# Patient Record
Sex: Female | Born: 1950 | Race: Black or African American | Hispanic: No | Smoking: Never smoker
Health system: Southern US, Community
[De-identification: ages and names within clinical notes are randomized; demographics above are authoritative.]

## PROBLEM LIST (undated history)

## (undated) DIAGNOSIS — K219 Gastro-esophageal reflux disease without esophagitis: Secondary | ICD-10-CM

## (undated) DIAGNOSIS — E7800 Pure hypercholesterolemia, unspecified: Secondary | ICD-10-CM

## (undated) DIAGNOSIS — I10 Essential (primary) hypertension: Secondary | ICD-10-CM

---

## 2004-05-10 ENCOUNTER — Ambulatory Visit: Payer: Self-pay | Admitting: Internal Medicine

## 2004-08-29 ENCOUNTER — Other Ambulatory Visit: Payer: Self-pay

## 2004-08-29 ENCOUNTER — Emergency Department: Payer: Self-pay | Admitting: Internal Medicine

## 2005-09-24 ENCOUNTER — Ambulatory Visit: Payer: Self-pay | Admitting: Internal Medicine

## 2006-01-30 ENCOUNTER — Emergency Department: Payer: Self-pay | Admitting: Emergency Medicine

## 2006-04-03 ENCOUNTER — Emergency Department: Payer: Self-pay | Admitting: Emergency Medicine

## 2006-04-04 ENCOUNTER — Ambulatory Visit: Payer: Self-pay | Admitting: Internal Medicine

## 2006-04-04 ENCOUNTER — Other Ambulatory Visit: Payer: Self-pay

## 2008-11-21 DIAGNOSIS — M1990 Unspecified osteoarthritis, unspecified site: Secondary | ICD-10-CM | POA: Insufficient documentation

## 2008-11-21 DIAGNOSIS — I10 Essential (primary) hypertension: Secondary | ICD-10-CM | POA: Insufficient documentation

## 2010-07-02 DIAGNOSIS — E039 Hypothyroidism, unspecified: Secondary | ICD-10-CM | POA: Insufficient documentation

## 2012-01-17 DIAGNOSIS — E785 Hyperlipidemia, unspecified: Secondary | ICD-10-CM | POA: Insufficient documentation

## 2012-08-25 DIAGNOSIS — R928 Other abnormal and inconclusive findings on diagnostic imaging of breast: Secondary | ICD-10-CM | POA: Insufficient documentation

## 2014-10-17 ENCOUNTER — Encounter: Payer: Self-pay | Admitting: Emergency Medicine

## 2014-10-17 ENCOUNTER — Emergency Department
Admission: EM | Admit: 2014-10-17 | Discharge: 2014-10-17 | Disposition: A | Discharge status: Home / Self Care | Payer: 59 | Attending: Emergency Medicine | Admitting: Emergency Medicine

## 2014-10-17 DIAGNOSIS — Y9389 Activity, other specified: Secondary | ICD-10-CM | POA: Diagnosis not present

## 2014-10-17 DIAGNOSIS — Y9289 Other specified places as the place of occurrence of the external cause: Secondary | ICD-10-CM | POA: Diagnosis not present

## 2014-10-17 DIAGNOSIS — I10 Essential (primary) hypertension: Secondary | ICD-10-CM | POA: Insufficient documentation

## 2014-10-17 DIAGNOSIS — T63441A Toxic effect of venom of bees, accidental (unintentional), initial encounter: Secondary | ICD-10-CM | POA: Diagnosis present

## 2014-10-17 DIAGNOSIS — Z79899 Other long term (current) drug therapy: Secondary | ICD-10-CM | POA: Insufficient documentation

## 2014-10-17 DIAGNOSIS — Y998 Other external cause status: Secondary | ICD-10-CM | POA: Diagnosis not present

## 2014-10-17 HISTORY — DX: Pure hypercholesterolemia, unspecified: E78.00

## 2014-10-17 HISTORY — DX: Essential (primary) hypertension: I10

## 2014-10-17 MED ORDER — CIMETIDINE 300 MG PO TABS: 300.0000 mg | ORAL_TABLET | Freq: Two times a day (BID) | ORAL | Status: DC

## 2014-10-17 MED ORDER — TETANUS-DIPHTH-ACELL PERTUSSIS 5-2.5-18.5 LF-MCG/0.5 IM SUSP: 0.5000 mL | Freq: Once | INTRAMUSCULAR | Status: DC

## 2014-10-17 MED ORDER — SULFAMETHOXAZOLE-TRIMETHOPRIM 800-160 MG PO TABS: 1.0000 | ORAL_TABLET | Freq: Two times a day (BID) | ORAL | Status: DC

## 2014-10-17 MED ORDER — DIPHENHYDRAMINE HCL 50 MG/ML IJ SOLN
50.0000 mg | Freq: Once | INTRAMUSCULAR | Status: AC
  Administered 2014-10-17: 50 mg via INTRAMUSCULAR
  Filled 2014-10-17: qty 1

## 2014-10-17 MED ORDER — CEPHALEXIN 500 MG PO CAPS: 500.0000 mg | ORAL_CAPSULE | Freq: Four times a day (QID) | ORAL | Status: DC

## 2014-10-17 MED ORDER — HYDROXYZINE PAMOATE 25 MG PO CAPS: 25.0000 mg | ORAL_CAPSULE | Freq: Three times a day (TID) | ORAL | Status: AC | PRN

## 2014-10-17 MED ORDER — RANITIDINE HCL 150 MG/10ML PO SYRP
300.0000 mg | ORAL_SOLUTION | Freq: Once | ORAL | Status: AC
  Administered 2014-10-17: 300 mg via ORAL
  Filled 2014-10-17: qty 20

## 2014-10-17 NOTE — Discharge Instructions (Signed)

## 2014-10-17 NOTE — ED Provider Notes (Signed)
[facility] Emergency Department Provider Note  ____________________________________________  Time seen: Approximately 11:16 PM  I have reviewed the triage vital signs and the nursing notes.   HISTORY  Chief Complaint Insect Bite    HPI Lynn Paul is a 64 y.o. female presents for evaluation of bee stings to her back and left arm. Patient states that these attacked her 3 days ago and she continues to itch and swell. Denies taking any medication other than the hydrocortisone cream. Any shortness of breath or chest pains.   Past Medical History  Diagnosis Date  . Hypertension   . Hypercholesteremia     There are no active problems to display for this patient.   History reviewed. No pertinent past surgical history.  Current Outpatient Rx  Name  Route  Sig  Dispense  Refill  . cholecalciferol (VITAMIN D) 1000 UNITS tablet   Oral   Take 1,000 Units by mouth daily.         . hydrochlorothiazide (HYDRODIURIL) 25 MG tablet   Oral   Take 25 mg by mouth daily.         Marland Kitchen levothyroxine (SYNTHROID, LEVOTHROID) 75 MCG tablet   Oral   Take 75 mcg by mouth daily before breakfast.         . lovastatin (MEVACOR) 20 MG tablet   Oral   Take 20 mg by mouth daily.         Marland Kitchen omeprazole (PRILOSEC) 20 MG capsule   Oral   Take 20 mg by mouth daily.         . cimetidine (TAGAMET) 300 MG tablet   Oral   Take 1 tablet (300 mg total) by mouth 2 (two) times daily.   14 tablet   0   . hydrOXYzine (VISTARIL) 25 MG capsule   Oral   Take 1 capsule (25 mg total) by mouth 3 (three) times daily as needed.   30 capsule   0     Allergies Review of patient's allergies indicates no known allergies.  History reviewed. No pertinent family history.  Social History History  Substance Use Topics  . Smoking status: Never Smoker   . Smokeless tobacco: Never Used  . Alcohol Use: No    Review of Systems Constitutional: No fever/chills Eyes: No  visual changes. ENT: No sore throat. Cardiovascular: Denies chest pain. Respiratory: Denies shortness of breath. Gastrointestinal: No abdominal pain.  No nausea, no vomiting.  No diarrhea.  No constipation. Genitourinary: Negative for dysuria. Musculoskeletal: Negative for back pain. Skin: Positive for hives and erythema to the back and left forearm. Neurological: Negative for headaches, focal weakness or numbness.  10-point ROS otherwise negative.  ____________________________________________   PHYSICAL EXAM:  VITAL SIGNS: ED Triage Vitals  Enc Vitals Group     BP --      Pulse --      Resp --      Temp --      Temp src --      SpO2 --      Weight --      Height --      Head Cir --      Peak Flow --      Pain Score --      Pain Loc --      Pain Edu? --      Excl. in GC? --     Constitutional: Alert and oriented. Well appearing and in no acute distress. Mouth/Throat: Mucous membranes are moist.  Oropharynx non-erythematous. Neck: No stridor.   Cardiovascular: Normal rate, regular rhythm. Grossly normal heart sounds.  Good peripheral circulation. Respiratory: Normal respiratory effort.  No retractions. Lungs CTAB. Musculoskeletal: No lower extremity tenderness nor edema.  No joint effusions. Neurologic:  Normal speech and language. No gross focal neurologic deficits are appreciated. No gait instability. Skin:  Skin is warm, dry and intact. Multiple areas of erythema with urticaria and hives to the back and left forearm. Psychiatric: Mood and affect are normal. Speech and behavior are normal.  ____________________________________________   LABS (all labs ordered are listed, but only abnormal results are displayed)  Labs Reviewed - No data to display ____________________________________________   PROCEDURES  Procedure(s) performed: None  Critical Care performed: No  ____________________________________________   INITIAL IMPRESSION / ASSESSMENT AND PLAN /  ED COURSE  Pertinent labs & imaging results that were available during my care of the patient were reviewed by me and considered in my medical decision making (see chart for details).  Multiple bee stings. Rx given for Vistaril 25 mg every 6 hours, Tagamet  300 mg twice a day. she is to continue her hydrocortisone cream. Patient voices understanding and denies any other emergency medical complaints at this time. Benadryl 50 mg IM given as well as Zantac 300 mg given by mouth while in the ER. Patient observed and discharged without complaints. ____________________________________________   FINAL CLINICAL IMPRESSION(S) / ED DIAGNOSES  Final diagnoses:  Allergic reaction to bee sting, accidental or unintentional, initial encounter      Evangeline Dakin, PA-C 10/17/14 2346  Maurilio Lovely, MD 10/18/14 1610

## 2014-10-17 NOTE — ED Notes (Signed)
PA at the bedside for pt evaluation 

## 2014-10-17 NOTE — ED Notes (Signed)
Patient to ED with c/o bee stings to back and left arm that happened Saturday, reports areas still bothering her.

## 2015-08-24 DIAGNOSIS — K219 Gastro-esophageal reflux disease without esophagitis: Secondary | ICD-10-CM | POA: Insufficient documentation

## 2015-08-24 DIAGNOSIS — R7303 Prediabetes: Secondary | ICD-10-CM | POA: Insufficient documentation

## 2016-12-06 ENCOUNTER — Emergency Department
Admission: EM | Admit: 2016-12-06 | Discharge: 2016-12-06 | Disposition: A | Discharge status: Home / Self Care | Payer: Medicare Other | Attending: Emergency Medicine | Admitting: Emergency Medicine

## 2016-12-06 ENCOUNTER — Emergency Department: Payer: Medicare Other

## 2016-12-06 DIAGNOSIS — M545 Low back pain: Secondary | ICD-10-CM | POA: Diagnosis not present

## 2016-12-06 DIAGNOSIS — Z79899 Other long term (current) drug therapy: Secondary | ICD-10-CM | POA: Insufficient documentation

## 2016-12-06 DIAGNOSIS — R1032 Left lower quadrant pain: Secondary | ICD-10-CM | POA: Diagnosis not present

## 2016-12-06 DIAGNOSIS — I10 Essential (primary) hypertension: Secondary | ICD-10-CM | POA: Diagnosis not present

## 2016-12-06 HISTORY — DX: Gastro-esophageal reflux disease without esophagitis: K21.9

## 2016-12-06 LAB — URINALYSIS, COMPLETE (UACMP) WITH MICROSCOPIC
Bacteria, UA: NONE SEEN
Bilirubin Urine: NEGATIVE
GLUCOSE, UA: NEGATIVE mg/dL
HGB URINE DIPSTICK: NEGATIVE
KETONES UR: NEGATIVE mg/dL
Nitrite: NEGATIVE
Protein, ur: NEGATIVE mg/dL
Specific Gravity, Urine: 1.023 (ref 1.005–1.030)
pH: 7 (ref 5.0–8.0)

## 2016-12-06 LAB — CBC
HCT: 36.2 % (ref 35.0–47.0)
HEMOGLOBIN: 12.7 g/dL (ref 12.0–16.0)
MCH: 32 pg (ref 26.0–34.0)
MCHC: 34.9 g/dL (ref 32.0–36.0)
MCV: 91.7 fL (ref 80.0–100.0)
Platelets: 202 10*3/uL (ref 150–440)
RBC: 3.95 MIL/uL (ref 3.80–5.20)
RDW: 12.8 % (ref 11.5–14.5)
WBC: 9.1 10*3/uL (ref 3.6–11.0)

## 2016-12-06 LAB — COMPREHENSIVE METABOLIC PANEL
ALK PHOS: 71 U/L (ref 38–126)
ALT: 20 U/L (ref 14–54)
ANION GAP: 11 (ref 5–15)
AST: 23 U/L (ref 15–41)
Albumin: 4.3 g/dL (ref 3.5–5.0)
BILIRUBIN TOTAL: 0.6 mg/dL (ref 0.3–1.2)
BUN: 13 mg/dL (ref 6–20)
CO2: 27 mmol/L (ref 22–32)
Calcium: 9.8 mg/dL (ref 8.9–10.3)
Chloride: 102 mmol/L (ref 101–111)
Creatinine, Ser: 0.72 mg/dL (ref 0.44–1.00)
GFR calc non Af Amer: >60 mL/min (ref >60)
Glucose, Bld: 107 mg/dL — ABNORMAL HIGH (ref 65–99)
Potassium: 3.5 mmol/L (ref 3.5–5.1)
SODIUM: 140 mmol/L (ref 135–145)
Total Protein: 7.7 g/dL (ref 6.5–8.1)

## 2016-12-06 LAB — LIPASE, BLOOD: Lipase: 20 U/L (ref 11–51)

## 2016-12-06 MED ORDER — KETOROLAC TROMETHAMINE 60 MG/2ML IM SOLN
15.0000 mg | Freq: Once | INTRAMUSCULAR | Status: AC
  Administered 2016-12-06: 15 mg via INTRAMUSCULAR
  Filled 2016-12-06: qty 2

## 2016-12-06 MED ORDER — NAPROXEN 500 MG PO TABS
500.0000 mg | ORAL_TABLET | Freq: Two times a day (BID) | ORAL | 0 refills | Status: DC
Start: 2016-12-06 — End: 2020-06-06

## 2016-12-06 NOTE — ED Notes (Signed)
Pt discharged home after verbalizing understanding of discharge instructions; nad noted. 

## 2016-12-06 NOTE — Discharge Instructions (Signed)
Your lab tests and CT scan today are unremarkable.  We are unable to find a cause of your pain, but there does not appear to be a serious medical issue at this time.  Continue monitoring your symptoms and follow up with your doctor this week.

## 2016-12-06 NOTE — ED Notes (Signed)
Pt presents with lower abdominal pain on left side, lower mid back pain. She denies pain or burning upon urination, nausea, vomiting, fever, chills. Pt alert & oriented with NAD noted.

## 2016-12-06 NOTE — ED Provider Notes (Signed)
[facility] Emergency Department Provider Note  ____________________________________________  Time seen: Approximately 4:29 PM  I have reviewed the triage vital signs and the nursing notes.   HISTORY  Chief Complaint Abdominal Pain and Back Pain    HPI Lynn Paul is a 66 y.o. female who complains of constant left lower quadrant pain and left lower back pain for the past 2 days. It is aching, worse with standing motion from a seated position, better with rest. No nausea vomiting diarrhea or constipation. No dysuria frequency urgency. No vaginal bleeding or discharge. No trauma. Moderate intensity.     Past Medical History:  Diagnosis Date  . Acid reflux   . Hypercholesteremia   . Hypertension      There are no active problems to display for this patient.    History reviewed. No pertinent surgical history.   Prior to Admission medications   Medication Sig Start Date End Date Taking? Authorizing Provider  cholecalciferol (VITAMIN D) 1000 UNITS tablet Take 1,000 Units by mouth daily.    [provider]  cimetidine (TAGAMET) 300 MG tablet Take 1 tablet (300 mg total) by mouth 2 (two) times daily. 10/17/14   Beers, Charmayne Sheer, PA-C  hydrochlorothiazide (HYDRODIURIL) 25 MG tablet Take 25 mg by mouth daily.    [provider]  hydrOXYzine (VISTARIL) 25 MG capsule Take 1 capsule (25 mg total) by mouth 3 (three) times daily as needed. 10/17/14   Beers, Charmayne Sheer, PA-C  levothyroxine (SYNTHROID, LEVOTHROID) 75 MCG tablet Take 75 mcg by mouth daily before breakfast.    [provider]  lovastatin (MEVACOR) 20 MG tablet Take 20 mg by mouth daily.    [provider]  naproxen (NAPROSYN) 500 MG tablet Take 1 tablet (500 mg total) by mouth 2 (two) times daily with a meal. 12/06/16   Sharman Cheek, MD  omeprazole (PRILOSEC) 20 MG capsule Take 20 mg by mouth daily.    [provider]     Allergies Patient has no  known allergies.   No family history on file.  Social History Social History  Substance Use Topics  . Smoking status: Never Smoker  . Smokeless tobacco: Never Used  . Alcohol use No    Review of Systems  Constitutional:   No fever or chills.  ENT:   No sore throat. No rhinorrhea. Cardiovascular:   No chest pain or syncope. Respiratory:   No dyspnea or cough. Gastrointestinal:   positive as above for abdominal pain without vomiting and diarrhea.  Musculoskeletal:   Negative for focal pain or swelling All other systems reviewed and are negative except as documented above in ROS and HPI.  ____________________________________________   PHYSICAL EXAM:  VITAL SIGNS: ED Triage Vitals [12/06/16 1112]  Enc Vitals Group     BP 139/62     Pulse Rate 63     Resp 18     Temp 97.7 F (36.5 C)     Temp Source Oral     SpO2 100 %     Weight 168 lb (76.2 kg)     Height  (1.651 m)     Head Circumference      Peak Flow      Pain Score 10     Pain Loc      Pain Edu?      Excl. in GC?     Vital signs reviewed, nursing assessments reviewed.   Constitutional:   Alert and oriented. Well appearing and in no  distress. Eyes:   No scleral icterus.  EOMI. No nystagmus. No conjunctival pallor. PERRL. ENT   Head:   Normocephalic and atraumatic.   Nose:   No congestion/rhinnorhea.    Mouth/Throat:   MMM, no pharyngeal erythema. No peritonsillar mass.    Neck:   No meningismus. Full ROM Hematological/Lymphatic/Immunilogical:   No cervical lymphadenopathy. Cardiovascular:   RRR. Symmetric bilateral radial and DP pulses.  No murmurs.  Respiratory:   Normal respiratory effort without tachypnea/retractions. Breath sounds are clear and equal bilaterally. No wheezes/rales/rhonchi. Gastrointestinal:   Soft and nontender. Non distended. There is no CVA tenderness.  No rebound, rigidity, or guarding. Genitourinary:   deferred Musculoskeletal:   Normal range of motion in all  extremities. No joint effusions.  No lower extremity tenderness.  No edema.no pain with extension of the left hip. Neurologic:   Normal speech and language.  Motor grossly intact. No gross focal neurologic deficits are appreciated.  Skin:    Skin is warm, dry and intact. No rash noted.  No petechiae, purpura, or bullae.  ____________________________________________    LABS (pertinent positives/negatives) (all labs ordered are listed, but only abnormal results are displayed) Labs Reviewed  COMPREHENSIVE METABOLIC PANEL - Abnormal; Notable for the following:       Result Value   Glucose, Bld 107 (*)    All other components within normal limits  URINALYSIS, COMPLETE (UACMP) WITH MICROSCOPIC - Abnormal; Notable for the following:    Color, Urine YELLOW (*)    APPearance CLEAR (*)    Leukocytes, UA SMALL (*)    Squamous Epithelial / LPF 0-5 (*)    All other components within normal limits  LIPASE, BLOOD  CBC   ____________________________________________   EKG    ____________________________________________    RADIOLOGY  Ct Renal Stone Study  Result Date: 12/06/2016 CLINICAL DATA:  66 year old female with history of left lower quadrant abdominal pain for the past 2 days and lower back pain for the past 2 days. EXAM: CT ABDOMEN AND PELVIS WITHOUT CONTRAST TECHNIQUE: Multidetector CT imaging of the abdomen and pelvis was performed following the standard protocol without IV contrast. COMPARISON:  No priors. FINDINGS: Lower chest: Aortic atherosclerosis. Hepatobiliary: No definite cystic or solid hepatic lesions are identified on today's noncontrast CT examination. Calcified granuloma in the periphery of segment 5 of the liver incidentally noted. Gallbladder is normal in appearance. Pancreas: No definite pancreatic mass or peripancreatic inflammatory changes are noted on today's noncontrast CT examination. Spleen: Unremarkable. Adrenals/Urinary Tract: There are no abnormal  calcifications within the collecting system of either kidney, along the course of either ureter, or within the lumen of the urinary bladder. No hydroureteronephrosis or perinephric stranding to suggest urinary tract obstruction at this time. The unenhanced appearance of the kidneys is unremarkable bilaterally. Unenhanced appearance of the urinary bladder is normal. Bilateral adrenal glands are normal in appearance. Stomach/Bowel: Unenhanced appearance of the stomach is normal. No pathologic dilatation of small bowel or colon. A few scattered colonic diverticulae are noted, without surrounding inflammatory changes to suggest an acute diverticulitis at this time. Normal appendix. Vascular/Lymphatic: Aortic atherosclerosis, without evidence of aneurysm in the abdominal or pelvic vasculature. No lymphadenopathy noted in the abdomen or pelvis. Reproductive: Small coarse calcifications in the fundus of the uterus, presumably related to an underlying fibroid. Ovaries are unremarkable in appearance. Other: No significant volume of ascites.  No pneumoperitoneum. Musculoskeletal: There are no aggressive appearing lytic or blastic lesions noted in the visualized portions of the skeleton. IMPRESSION: 1. No  acute findings are noted in the abdomen or pelvis to account for the patient's symptoms. 2. Colonic diverticulosis without evidence of acute diverticulitis at this time. 3. Aortic atherosclerosis. 4. Normal appendix. Aortic Atherosclerosis (ICD10-I70.0). Electronically Signed   By: Trudie Reed M.D.   On: 12/06/2016 15:25    ____________________________________________   PROCEDURES Procedures  ____________________________________________   INITIAL IMPRESSION / ASSESSMENT AND PLAN / ED COURSE  Pertinent labs & imaging results that were available during my care of the patient were reviewed by me and considered in my medical decision making (see chart for details).  patient's well-appearing no acute  distress, presents with left lower quadrant pain. Vitals are normal, labs are unremarkable, and exam is benign. Due to age and lack of a specific etiology that I can pinpoint, CT scan was obtained which is also unremarkable. No evidence of stone or obstruction or diverticulitis. Plan to discharge the patient home, continue NSAIDs, follow-up with primary care.Considering the patient's symptoms, medical history, and physical examination today, I have low suspicion for cholecystitis or biliary pathology, pancreatitis, perforation or bowel obstruction, hernia, intra-abdominal abscess, AAA or dissection, volvulus or intussusception, mesenteric ischemia, or appendicitis.        ____________________________________________   FINAL CLINICAL IMPRESSION(S) / ED DIAGNOSES  Final diagnoses:  LLQ pain      New Prescriptions   NAPROXEN (NAPROSYN) 500 MG TABLET    Take 1 tablet (500 mg total) by mouth 2 (two) times daily with a meal.     Portions of this note were generated with dragon dictation software. Dictation errors may occur despite best attempts at proofreading.    Sharman Cheek, MD 12/06/16 [phone]

## 2016-12-06 NOTE — ED Triage Notes (Signed)
Pt c/o LLQ pain X 2 days, lower back pain X 2 days. Pt alert and oriented X4, active, cooperative, pt in NAD. RR even and unlabored, color WNL.

## 2017-03-18 DIAGNOSIS — R198 Other specified symptoms and signs involving the digestive system and abdomen: Secondary | ICD-10-CM | POA: Insufficient documentation

## 2019-06-09 DIAGNOSIS — Z720 Tobacco use: Secondary | ICD-10-CM | POA: Insufficient documentation

## 2019-10-12 ENCOUNTER — Other Ambulatory Visit: Payer: Self-pay

## 2019-10-12 ENCOUNTER — Ambulatory Visit: Payer: Medicare Other | Admitting: Podiatry

## 2019-10-12 DIAGNOSIS — M79674 Pain in right toe(s): Secondary | ICD-10-CM | POA: Diagnosis not present

## 2019-10-12 DIAGNOSIS — Q828 Other specified congenital malformations of skin: Secondary | ICD-10-CM

## 2019-10-12 DIAGNOSIS — M7752 Other enthesopathy of left foot: Secondary | ICD-10-CM

## 2019-10-12 DIAGNOSIS — B351 Tinea unguium: Secondary | ICD-10-CM | POA: Diagnosis not present

## 2019-10-12 DIAGNOSIS — M79675 Pain in left toe(s): Secondary | ICD-10-CM | POA: Diagnosis not present

## 2019-10-13 ENCOUNTER — Encounter: Payer: Self-pay | Admitting: Podiatry

## 2019-10-13 NOTE — Progress Notes (Signed)
  Subjective:  Patient ID: Lynn Paul, female    DOB: Apr 03, 1950,  MRN: [id number]  Chief Complaint  Patient presents with  . Callouses    pt is here for a corn/ callus trim   69 y.o. female returns for the above complaint.  Patient presents with thickened elongated dystrophic toenails x10.  Patient states is mildly painful to touch.  Patient would like to have them debrided down as she is not able to do it herself.  She has secondary complaint of left fifth metatarsophalangeal joint capsulitis with underlying benign skin lesion/porokeratosis.  Patient states is painful to walk on.  She has tried offloading it has had tried over-the-counter treatment options but none of that has helped.  She would like for me to discuss treatment options with her.  She denies any other acute complaints.  Objective:  There were no vitals filed for this visit. Podiatric Exam: Vascular: dorsalis pedis and posterior tibial pulses are palpable bilateral. Capillary return is immediate. Temperature gradient is WNL. Skin turgor WNL  Sensorium: Normal Semmes Weinstein monofilament test. Normal tactile sensation bilaterally. Nail Exam: Pt has thick disfigured discolored nails with subungual debris noted bilateral entire nail hallux through fifth toenails.  Pain on palpation to the nails. Ulcer Exam: There is no evidence of ulcer or pre-ulcerative changes or infection. Orthopedic Exam: Muscle tone and strength are WNL. No limitations in general ROM. No crepitus or effusions noted. HAV  B/L.  Hammer toes 2-5  B/L. Skin: Porokeratosis noted to the left submetatarsal 5 with central nucleated core.  No pinpoint bleeding noted upon debridement no infection or ulcers    Assessment & Plan:  No diagnosis found.  Patient was evaluated and treated and all questions answered.  Left fifth MPJ porokeratosis/benign skin lesion with underlying capsulitis -I explained to the patient the etiology of porokeratosis and various  treatment options were asked discussed.  I explained to her that given the pain that she is having with range of motion of the fifth metatarsophalangeal joint.  There is probably inflammation component associated with pain.  I believe she will benefit from a steroid injection.  Patient agrees with the plan would like to proceed with steroid injection.  I will also plan on doing aggressive debridement with excision of the central nucleated core. -Using chisel blade and a handle the nucleated core was excised out with relief of pain. -A steroid injection was performed at left fifth MPJ using 1% plain Lidocaine and 10 mg of Kenalog. This was well tolerated.   Onychomycosis with pain  -Nails palliatively debrided as below. -Educated on self-care  Procedure: Nail Debridement Rationale: pain  Type of Debridement: manual, sharp debridement. Instrumentation: Nail nipper, rotary burr. Number of Nails: 10  Procedures and Treatment: Consent by patient was obtained for treatment procedures. The patient understood the discussion of treatment and procedures well. All questions were answered thoroughly reviewed. Debridement of mycotic and hypertrophic toenails, 1 through 5 bilateral and clearing of subungual debris. No ulceration, no infection noted.  Return Visit-Office Procedure: Patient instructed to return to the office for a follow up visit 3 months for continued evaluation and treatment.  Nicholes Rough, DPM    No follow-ups on file.

## 2019-12-08 ENCOUNTER — Encounter: Payer: Medicare Other | Admitting: Obstetrics and Gynecology

## 2019-12-08 NOTE — Progress Notes (Deleted)
Paul, Lynn Hibbs, MD   No chief complaint on file.   HPI:      [name] is a 69 y.o. No obstetric history on file. whose LMP was No LMP recorded. Patient is postmenopausal., presents today for ***    Past Medical History:  Diagnosis Date   Acid reflux    Hypercholesteremia    Hypertension     No past surgical history on file.  No family history on file.  Social History   Socioeconomic History   Marital status: Widowed    Spouse name: Not on file   Number of children: Not on file   Years of education: Not on file   Highest education level: Not on file  Occupational History   Not on file  Tobacco Use   Smoking status: Never Smoker   Smokeless tobacco: Never Used  Substance and Sexual Activity   Alcohol use: No   Drug use: No   Sexual activity: Not on file  Other Topics Concern   Not on file  Social History Narrative   Not on file   Social Determinants of Health   Financial Resource Strain:    Difficulty of Paying Living Expenses: Not on file  Food Insecurity:    Worried About Running Out of Food in the Last Year: Not on file   Ran Out of Food in the Last Year: Not on file  Transportation Needs:    Lack of Transportation (Medical): Not on file   Lack of Transportation (Non-Medical): Not on file  Physical Activity:    Days of Exercise per Week: Not on file   Minutes of Exercise per Session: Not on file  Stress:    Feeling of Stress : Not on file  Social Connections:    Frequency of Communication with Friends and Family: Not on file   Frequency of Social Gatherings with Friends and Family: Not on file   Attends Religious Services: Not on file   Active Member of Clubs or Organizations: Not on file   Attends Banker Meetings: Not on file   Marital Status: Not on file  Intimate Partner Violence:    Fear of Current or Ex-Partner: Not on file   Emotionally Abused: Not on file   Physically  Abused: Not on file   Sexually Abused: Not on file    Outpatient Medications Prior to Visit  Medication Sig Dispense Refill   amLODipine (NORVASC) 5 MG tablet Take 5 mg by mouth 2 (two) times daily.     cholecalciferol (VITAMIN D) 1000 UNITS tablet Take 1,000 Units by mouth daily.     cimetidine (TAGAMET) 300 MG tablet Take 1 tablet (300 mg total) by mouth 2 (two) times daily. (Patient not taking: Reported on 10/12/2019) 14 tablet 0   hydrochlorothiazide (HYDRODIURIL) 25 MG tablet Take 25 mg by mouth daily.     hydrOXYzine (VISTARIL) 25 MG capsule Take 1 capsule (25 mg total) by mouth 3 (three) times daily as needed. 30 capsule 0   levothyroxine (SYNTHROID, LEVOTHROID) 75 MCG tablet Take 75 mcg by mouth daily before breakfast.     lovastatin (MEVACOR) 40 MG tablet Take 40 mg by mouth daily.     naproxen (NAPROSYN) 500 MG tablet Take 1 tablet (500 mg total) by mouth 2 (two) times daily with a meal. 20 tablet 0   nitrofurantoin, macrocrystal-monohydrate, (MACROBID) 100 MG capsule Take 100 mg by mouth every 12 (twelve) hours.     omeprazole (PRILOSEC)  20 MG capsule Take 20 mg by mouth daily.     No facility-administered medications prior to visit.      ROS:  Review of Systems BREAST: No symptoms   OBJECTIVE:   Vitals:  There were no vitals taken for this visit.  Physical Exam  Results: No results found for this or any previous visit (from the past 24 hour(s)).   Assessment/Plan: No diagnosis found.    No orders of the defined types were placed in this encounter.     No follow-ups on file.  Ulla Mckiernan B. Willeen Novak, PA-C 12/08/2019 10:33 AM

## 2019-12-20 ENCOUNTER — Encounter: Payer: Self-pay | Admitting: Obstetrics & Gynecology

## 2019-12-20 ENCOUNTER — Other Ambulatory Visit (HOSPITAL_COMMUNITY)
Admission: RE | Admit: 2019-12-20 | Discharge: 2019-12-20 | Disposition: A | Discharge status: Home / Self Care | Payer: Medicare Other | Source: Ambulatory Visit | Attending: Obstetrics & Gynecology | Admitting: Obstetrics & Gynecology

## 2019-12-20 ENCOUNTER — Other Ambulatory Visit: Payer: Self-pay

## 2019-12-20 ENCOUNTER — Ambulatory Visit (INDEPENDENT_AMBULATORY_CARE_PROVIDER_SITE_OTHER): Payer: Medicare Other | Admitting: Obstetrics & Gynecology

## 2019-12-20 VITALS — BP 128/80 | Ht 65.0 in | Wt 170.0 lb

## 2019-12-20 DIAGNOSIS — Z124 Encounter for screening for malignant neoplasm of cervix: Secondary | ICD-10-CM

## 2019-12-20 DIAGNOSIS — N950 Postmenopausal bleeding: Secondary | ICD-10-CM

## 2019-12-20 DIAGNOSIS — R3989 Other symptoms and signs involving the genitourinary system: Secondary | ICD-10-CM

## 2019-12-20 LAB — POCT URINALYSIS DIPSTICK
Bilirubin, UA: NEGATIVE
Blood, UA: NEGATIVE
Glucose, UA: NEGATIVE
Ketones, UA: NEGATIVE
Nitrite, UA: NEGATIVE
Protein, UA: NEGATIVE
Spec Grav, UA: 1.01 (ref 1.010–1.025)
Urobilinogen, UA: 0.2 E.U./dL
pH, UA: 5 (ref 5.0–8.0)

## 2019-12-20 NOTE — Addendum Note (Signed)
Addended by: Cornelius Moras D on: 12/20/2019 09:34 AM   Modules accepted: Orders

## 2019-12-20 NOTE — Patient Instructions (Signed)

## 2019-12-20 NOTE — Progress Notes (Signed)
Lynn Paul Patient is a 69 yo G2P2 AA F who complains of 2 month h/o intermittant vaginal Paul. She has been menopausal for several years. Currently on no HRT and no blood thinner medicine.  She did start a new NSAID 2 mos ago for knee pain/ OA.   Paul is described as scant staining and has occurred a few times. Other menopausal symptoms include: none. Workup to date: she has been seen w hematuria noted and has been treated once for UTI (over the last 2 mos).  Menstrual History: OB History   NSVD x2 Has 4 grandchildren and 1 great     PMHx: She  has a past medical history of Acid reflux, Hypercholesteremia, and Hypertension. Also,  has no past surgical history on file., family history includes Breast cancer (age of onset: 22) in her daughter; Colon cancer (age of onset: 76) in her brother.,  reports that she has never smoked. She has never used smokeless tobacco. She reports that she does not drink alcohol and does not use drugs.  She has a current medication list which includes the following prescription(s): amlodipine, cholecalciferol, hydrochlorothiazide, levothyroxine, lovastatin, meloxicam, omeprazole, cimetidine, hydroxyzine, naproxen, and nitrofurantoin (macrocrystal-monohydrate). Also, has No Known Allergies.  Review of Systems  Constitutional: Negative for chills, fever and malaise/fatigue.  HENT: Negative for congestion, sinus pain and sore throat.   Eyes: Negative for blurred vision and pain.  Respiratory: Negative for cough and wheezing.   Cardiovascular: Negative for chest pain and leg swelling.  Gastrointestinal: Negative for abdominal pain, constipation, diarrhea, heartburn, nausea and vomiting.  Genitourinary: Negative for dysuria, frequency, hematuria and urgency.  Musculoskeletal: Negative for back pain, joint pain, myalgias and neck pain.  Skin: Negative for itching and rash.  Neurological: Negative for dizziness, tremors and weakness.    Endo/Heme/Allergies: Does not bruise/bleed easily.  Psychiatric/Behavioral: Negative for depression. The patient is not nervous/anxious and does not have insomnia.     Objective: BP 128/80    Ht 5\' 5"  (1.651 m)    Wt 170 lb (77.1 kg)    BMI 28.29 kg/m  Physical Exam Constitutional:      General: She is not in acute distress.    Appearance: She is well-developed.  Genitourinary:     Pelvic exam was performed with patient supine.     Vagina and uterus normal.     No vaginal erythema or Paul.     No cervical motion tenderness, discharge, polyp or nabothian cyst.     Uterus is mobile.     Uterus is not enlarged.     No uterine mass detected.    Uterus is midaxial.     No right or left adnexal mass present.     Right adnexa not tender.     Left adnexa not tender.     Genitourinary Comments: Small uterus Small cervix, narrow canal  HENT:     Head: Normocephalic and atraumatic.     Nose: Nose normal.  Abdominal:     General: There is no distension.     Palpations: Abdomen is soft.     Tenderness: There is no abdominal tenderness.  Musculoskeletal:        General: Normal range of motion.  Neurological:     Mental Status: She is alert and oriented to person, place, and time.     Cranial Nerves: No cranial nerve deficit.  Skin:    General: Skin is warm and dry.  Psychiatric:        Attention  and Perception: Attention normal.        Mood and Affect: Mood and affect normal.        Speech: Speech normal.        Behavior: Behavior normal.        Thought Content: Thought content normal.        Judgment: Judgment normal.     ASSESSMENT/PLAN:     No problem-specific Assessment & Plan notes found for this encounter.  Problem List Items Addressed This Visit      Other   Post-menopausal Paul - Primary   Relevant Orders   Surgical pathology    Other Visit Diagnoses    Screening for cervical cancer       Relevant Orders   Cytology - PAP   Sensation of pressure in  bladder area       Relevant Orders   Urine Culture    Options for EMB, [place], exp mgt discussed.    EMB today.  Consider [place] if sx's persist, for polyps or other etiology.  Plan visit to discuss results one week.    May consider scheduling and [place], discuss more at that time  Endometrial Biopsy After discussion with the patient regarding her abnormal uterine Paul I recommended that she proceed with an endometrial biopsy for further diagnosis. The risks, benefits, alternatives, and indications for an endometrial biopsy were discussed with the patient in detail. She understood the risks including infection, Paul, cervical laceration and uterine perforation.  Verbal consent was obtained.   PROCEDURE NOTE:  Pipelle endometrial biopsy was performed using aseptic technique with iodine preparation.  The uterus was sounded to a length of 6 cm.  Adequate sampling was obtained with minimal blood loss.  The patient tolerated the procedure well.  Disposition will be pending pathology.  Annamarie Major, MD, [facility], [facility] 12/20/2019  9:21 AM

## 2019-12-21 LAB — CYTOLOGY - PAP: Diagnosis: NEGATIVE

## 2019-12-21 LAB — SURGICAL PATHOLOGY

## 2019-12-22 LAB — URINE CULTURE

## 2019-12-27 ENCOUNTER — Ambulatory Visit (INDEPENDENT_AMBULATORY_CARE_PROVIDER_SITE_OTHER): Payer: Medicare Other | Admitting: Obstetrics & Gynecology

## 2019-12-27 ENCOUNTER — Other Ambulatory Visit: Payer: Self-pay

## 2019-12-27 ENCOUNTER — Encounter: Payer: Self-pay | Admitting: Obstetrics & Gynecology

## 2019-12-27 VITALS — BP 120/80 | Ht 65.0 in | Wt 167.0 lb

## 2019-12-27 DIAGNOSIS — N950 Postmenopausal bleeding: Secondary | ICD-10-CM

## 2019-12-27 NOTE — Progress Notes (Signed)
  History of Present Illness:  Lynn Paul is a 69 y.o. who recently underwent labwork for PMB.  She presents for discussion of results and plan of management. Results revealed normal EMB, also normal PAP.  She continues w almost daily blood when wiping, pink.  No BRB, no pad needed.  No pain.  PMHx: She  has a past medical history of Acid reflux, Hypercholesteremia, and Hypertension. Also,  has no past surgical history on file., family history includes Breast cancer (age of onset: 62) in her daughter; Colon cancer (age of onset: 84) in her brother.,  reports that she has never smoked. She has never used smokeless tobacco. She reports that she does not drink alcohol and does not use drugs. Current Meds  Medication Sig  . amLODipine (NORVASC) 5 MG tablet Take 5 mg by mouth 2 (two) times daily.  . cholecalciferol (VITAMIN D) 1000 UNITS tablet Take 1,000 Units by mouth daily.  . cimetidine (TAGAMET) 300 MG tablet Take 1 tablet (300 mg total) by mouth 2 (two) times daily.  . hydrochlorothiazide (HYDRODIURIL) 25 MG tablet Take 25 mg by mouth daily.  . hydrOXYzine (VISTARIL) 25 MG capsule Take 1 capsule (25 mg total) by mouth 3 (three) times daily as needed.  Marland Kitchen levothyroxine (SYNTHROID, LEVOTHROID) 75 MCG tablet Take 75 mcg by mouth daily before breakfast.  . lovastatin (MEVACOR) 40 MG tablet Take 40 mg by mouth daily.  . meloxicam (MOBIC) 15 MG tablet Take 15 mg by mouth daily.  . naproxen (NAPROSYN) 500 MG tablet Take 1 tablet (500 mg total) by mouth 2 (two) times daily with a meal.  . omeprazole (PRILOSEC) 20 MG capsule Take 20 mg by mouth daily.  . Also, has No Known Allergies..  Review of Systems  All other systems reviewed and are negative.   Physical Exam:  BP 120/80   Ht 5\' 5"  (1.651 m)   Wt 167 lb (75.8 kg)   BMI 27.79 kg/m  Body mass index is 27.79 kg/m. Constitutional: Well nourished, well developed female in no acute distress.  Abdomen: diffusely non tender to palpation,  non distended, and no masses, hernias Neuro: Grossly intact Psych:  Normal mood and affect.    Assessment:  Problem List Items Addressed This Visit      Other   Post-menopausal bleeding - Primary      Plan: Detailed discussion of results today. Options for management discussed. Info provided. At this time, we will plan for to see if she has thickened lining or sogn of polyp or fibroid. She was amenable to this plan.  A total of 20 minutes were spent face-to-face with the patient as well as preparation, review, communication, and documentation during this encounter.    [place], MD, An[facility] Major [facility]/G[facility], [facility] 12/27/2019  9:51 AM

## 2020-01-10 ENCOUNTER — Other Ambulatory Visit: Payer: Self-pay

## 2020-01-10 ENCOUNTER — Ambulatory Visit: Payer: Medicare Other | Admitting: Obstetrics & Gynecology

## 2020-01-10 ENCOUNTER — Ambulatory Visit (INDEPENDENT_AMBULATORY_CARE_PROVIDER_SITE_OTHER): Payer: Medicare Other

## 2020-01-10 DIAGNOSIS — N950 Postmenopausal bleeding: Secondary | ICD-10-CM

## 2020-01-11 ENCOUNTER — Ambulatory Visit (INDEPENDENT_AMBULATORY_CARE_PROVIDER_SITE_OTHER): Payer: Medicare Other | Admitting: Obstetrics & Gynecology

## 2020-01-11 ENCOUNTER — Encounter: Payer: Self-pay | Admitting: Obstetrics & Gynecology

## 2020-01-11 VITALS — BP 120/80 | Ht 65.0 in | Wt 171.0 lb

## 2020-01-11 DIAGNOSIS — N950 Postmenopausal bleeding: Secondary | ICD-10-CM | POA: Diagnosis not present

## 2020-01-11 NOTE — Progress Notes (Signed)
  HPI: Pt has intermittent light bleeding, none ober the past week.  EMB and PAP normal recently.  [place] today to assess for alternatrive etiology.  Ultrasound demonstrates a thin ES (1.35mm) and two small fibroids, and potential fluid in the endocervical canal, see below  PMHx: She  has a past medical history of Acid reflux, Hypercholesteremia, and Hypertension. Also,  has no past surgical history on file., family history includes Breast cancer (age of onset: 48) in her daughter; Colon cancer (age of onset: 17) in her brother.,  reports that she has never smoked. She has never used smokeless tobacco. She reports that she does not drink alcohol and does not use drugs.  She has a current medication list which includes the following prescription(s): amlodipine, cholecalciferol, cimetidine, hydrochlorothiazide, hydroxyzine, levothyroxine, lovastatin, meloxicam, naproxen, nitrofurantoin (macrocrystal-monohydrate), and omeprazole. Also, has No Known Allergies.  Review of Systems  All other systems reviewed and are negative.   Objective: BP 120/80   Ht 5\' 5"  (1.651 m)   Wt 171 lb (77.6 kg)   BMI 28.46 kg/m   Physical examination Constitutional NAD, Conversant  Skin No rashes, lesions or ulceration.   Extremities: Moves all appropriately.  Normal ROM for age. No lymphadenopathy.  Neuro: Grossly intact  Psych: Oriented to PPT.  Normal mood. Normal affect.   PELVIC COMPLETE WITH TRANSVAGINAL  Result Date: 01/10/2020 Patient Name: Lynn Paul DOB: 02/07/1951 MRN: [id number] ULTRASOUND REPORT Location: [facility] Date of Service: 01/10/2020 Indications:Abnormal Uterine Bleeding Findings: The uterus is anteverted and measures 5.9 x 4.3 x 2.8 cm. Echo texture is heterogenous with evidence of focal masses. Within the uterus are multiple suspected fibroids measuring: Fibroid 1:13.1 x 5.7 x 9.7 mm intramural right Fibroid 2:9.7 x 7.7 x 7.0 mm intramural right, partially calcified The  Endometrium measures 1.6 mm. There is a complex cystic area in the cervix that may be a complex cyst vs. Fluid in the cervical canal within complex tissue. No blood flow is seen within this cystic area and measures 14.7 x 9.3 x 11.8 mm. Neither ovary is visible. Survey of the adnexa demonstrates no adnexal masses. There is no free fluid in the cul de sac. Impression: 1. There is a complex cystic area within the cervix. 2. Thin endometrium. 3. Several intramural fibroids seen. There are questionably some other, small, ill-defined fibroids as well. 4. Neither ovary is visible. Recommendations: 1.Clinical correlation with the patient's History and Physical Exam. 13/03/2019, RT Review of ULTRASOUND.    I have personally reviewed images and report of recent ultrasound done at [facility].    Plan of management to be discussed with patient. [facility], MD, FACOG [facility], [facility] 01/10/2020  1:18 PM   Assessment:  Post-menopausal bleeding  Counseled on options- no nothing, D&C/hysteroscopy, and hysterectomy Pros and cons of each choice. Safe to wait as no apparent cancer.  If has recurring episodes, then most likely would then schedule D&C and hysteroscopy to assess further.  A total of 20 minutes were spent face-to-face with the patient as well as preparation, review, communication, and documentation during this encounter.   13/03/2019, MD, [facility] Major Ob/G[facility], [facility] 01/11/2020  11:38 AM

## 2020-01-18 ENCOUNTER — Ambulatory Visit: Payer: Medicare Other | Admitting: Podiatry

## 2020-03-07 ENCOUNTER — Ambulatory Visit: Payer: Medicare Other | Admitting: Podiatry

## 2020-03-07 ENCOUNTER — Encounter: Payer: Self-pay | Admitting: Podiatry

## 2020-03-07 ENCOUNTER — Other Ambulatory Visit: Payer: Self-pay

## 2020-03-07 DIAGNOSIS — L600 Ingrowing nail: Secondary | ICD-10-CM

## 2020-03-07 DIAGNOSIS — L603 Nail dystrophy: Secondary | ICD-10-CM

## 2020-03-07 NOTE — Progress Notes (Signed)
Subjective:  Patient ID: Lynn Paul, female    DOB: 1950-09-25,  MRN: [id number]  Chief Complaint  Patient presents with  . Nail Problem    Patient presents today for painful ingrown toenail 3rd left toe x 2-3 weeks.  She says its very sore to touch on the lateral side of nail.  She also c/o painful callous bottom of left 5th sub met.    69 y.o. female presents with the above complaint.  Patient presents with a complaint of left third digit lateral border ingrown that has been hurting her for quite some time.  Patient states painful to touch.  She would like to have removed.  She has not had any procedures done to the third toe in the past.  She states painful to walk on painful when the shoe rubs on it.  Pain scale 7 out of 10.  Is sharp shooting in nature.  Denies any other acute complaints.   Review of Systems: Negative except as noted in the HPI. Denies N/V/F/Ch.  Past Medical History:  Diagnosis Date  . Acid reflux   . Hypercholesteremia   . Hypertension     Current Outpatient Medications:  .  amLODipine (NORVASC) 5 MG tablet, Take 5 mg by mouth 2 (two) times daily., Disp: , Rfl:  .  cholecalciferol (VITAMIN D) 1000 UNITS tablet, Take 1,000 Units by mouth daily., Disp: , Rfl:  .  cimetidine (TAGAMET) 300 MG tablet, Take 1 tablet (300 mg total) by mouth 2 (two) times daily., Disp: 14 tablet, Rfl: 0 .  hydrochlorothiazide (HYDRODIURIL) 25 MG tablet, Take 25 mg by mouth daily., Disp: , Rfl:  .  hydrOXYzine (VISTARIL) 25 MG capsule, Take 1 capsule (25 mg total) by mouth 3 (three) times daily as needed., Disp: 30 capsule, Rfl: 0 .  levothyroxine (SYNTHROID, LEVOTHROID) 75 MCG tablet, Take 75 mcg by mouth daily before breakfast., Disp: , Rfl:  .  lovastatin (MEVACOR) 40 MG tablet, Take 40 mg by mouth daily., Disp: , Rfl:  .  meloxicam (MOBIC) 15 MG tablet, Take 15 mg by mouth daily., Disp: , Rfl:  .  naproxen (NAPROSYN) 500 MG tablet, Take 1 tablet (500 mg total) by mouth 2  (two) times daily with a meal., Disp: 20 tablet, Rfl: 0 .  nitrofurantoin, macrocrystal-monohydrate, (MACROBID) 100 MG capsule, Take 100 mg by mouth every 12 (twelve) hours. , Disp: , Rfl:  .  omeprazole (PRILOSEC) 20 MG capsule, Take 20 mg by mouth daily., Disp: , Rfl:   Social History   Tobacco Use  Smoking Status Never Smoker  Smokeless Tobacco Never Used    No Known Allergies Objective:  There were no vitals filed for this visit. There is no height or weight on file to calculate BMI. Constitutional Well developed. Well nourished.  Vascular Dorsalis pedis pulses palpable bilaterally. Posterior tibial pulses palpable bilaterally. Capillary refill normal to all digits.  No cyanosis or clubbing noted. Pedal hair growth normal.  Neurologic Normal speech. Oriented to person, place, and time. Epicritic sensation to light touch grossly present bilaterally.  Dermatologic Painful ingrowing nail at lateral nail borders of the third toe nail left. No other open wounds. No skin lesions.  Orthopedic: Normal joint ROM without pain or crepitus bilaterally. No visible deformities. No bony tenderness.   Radiographs: None Assessment:   1. Ingrown nail of third toe of left foot   2. Nail dystrophy    Plan:  Patient was evaluated and treated and all questions answered.  Ingrown Nail,  left with underlying nail dystrophy -Patient elects to proceed with minor surgery to remove ingrown toenail removal today. Consent reviewed and signed by patient. -Ingrown nail excised. See procedure note. -Educated on post-procedure care including soaking. Written instructions provided and reviewed. -Patient to follow up in 2 weeks for nail check.  Procedure: Excision of Ingrown Toenail Location: Left 3rd toe lateral nail borders. Anesthesia: Lidocaine 1% plain; 1.5 mL and Marcaine 0.5% plain; 1.5 mL, digital block. Skin Prep: Betadine. Dressing: Silvadene; telfa; dry, sterile, compression  dressing. Technique: Following skin prep, the toe was exsanguinated and a tourniquet was secured at the base of the toe. The affected nail border was freed, split with a nail splitter, and excised. Chemical matrixectomy was then performed with phenol and irrigated out with alcohol. The tourniquet was then removed and sterile dressing applied. Disposition: Patient tolerated procedure well. Patient to return in 2 weeks for follow-up.   No follow-ups on file.

## 2020-05-16 ENCOUNTER — Other Ambulatory Visit: Payer: Self-pay | Admitting: Obstetrics & Gynecology

## 2020-05-18 ENCOUNTER — Telehealth: Payer: Self-pay

## 2020-05-18 NOTE — Telephone Encounter (Signed)
-----   Message from Nadara Mustard, MD sent at 05/16/2020 10:34 AM EST ----- Regarding: MMG! Pt still has not had MMG done.  Please call and encourage her to do so, and even offer to help schedule the MMG if she feels this would help her.  The number is [phone] to schedule at [facility].  Do not let her hang up without a plan to have this done.  It is very important to Dr Tiburcio Pea to have this MMG done.  Thanks for the help.

## 2020-05-18 NOTE — Telephone Encounter (Signed)
Pt states she already had her mammogram done at [place].

## 2020-06-06 ENCOUNTER — Encounter: Payer: Self-pay | Admitting: Podiatry

## 2020-06-06 ENCOUNTER — Other Ambulatory Visit: Payer: Self-pay

## 2020-06-06 ENCOUNTER — Ambulatory Visit (INDEPENDENT_AMBULATORY_CARE_PROVIDER_SITE_OTHER): Payer: Medicare Other | Admitting: Podiatry

## 2020-06-06 DIAGNOSIS — B351 Tinea unguium: Secondary | ICD-10-CM | POA: Diagnosis not present

## 2020-06-06 DIAGNOSIS — Q828 Other specified congenital malformations of skin: Secondary | ICD-10-CM | POA: Diagnosis not present

## 2020-06-06 DIAGNOSIS — M79675 Pain in left toe(s): Secondary | ICD-10-CM | POA: Diagnosis not present

## 2020-06-06 DIAGNOSIS — M7752 Other enthesopathy of left foot: Secondary | ICD-10-CM | POA: Diagnosis not present

## 2020-06-06 DIAGNOSIS — M79674 Pain in right toe(s): Secondary | ICD-10-CM | POA: Diagnosis not present

## 2020-06-06 NOTE — Progress Notes (Signed)
  Subjective:  Patient ID: Lynn Paul, female    DOB: 1950/11/03,  MRN: [id number]  Chief Complaint  Patient presents with  . Nail Problem  . Callouses    Nail and callous trim   Ingrown toenail lateral border left hallux   70 y.o. female returns for the above complaint.  Patient presents with thickened elongated dystrophic toenails x10.  Patient states is mildly painful to touch.  Patient would like to have them debrided down as she is not able to do it herself.  She has secondary complaint of left fifth metatarsophalangeal joint capsulitis with underlying benign skin lesion/porokeratosis.  Patient states is painful to walk on.  She has tried offloading it has had tried over-the-counter treatment options but none of that has helped.  She said the previous injection helped considerably.  She would like to do another one.  Objective:  There were no vitals filed for this visit. Podiatric Exam: Vascular: dorsalis pedis and posterior tibial pulses are palpable bilateral. Capillary return is immediate. Temperature gradient is WNL. Skin turgor WNL  Sensorium: Normal Semmes Weinstein monofilament test. Normal tactile sensation bilaterally. Nail Exam: Pt has thick disfigured discolored nails with subungual debris noted bilateral entire nail hallux through fifth toenails.  Pain on palpation to the nails. Ulcer Exam: There is no evidence of ulcer or pre-ulcerative changes or infection. Orthopedic Exam: Muscle tone and strength are WNL. No limitations in general ROM. No crepitus or effusions noted. HAV  B/L.  Hammer toes 2-5  B/L. Skin: Porokeratosis noted to the left submetatarsal 5 with central nucleated core.  No pinpoint bleeding noted upon debridement no infection or ulcers    Assessment & Plan:   1. Capsulitis of metatarsophalangeal (MTP) joint of left foot   2. Porokeratosis   3. Pain due to onychomycosis of toenails of both feet     Patient was evaluated and treated and all  questions answered.  Left fifth MPJ porokeratosis/benign skin lesion with underlying capsulitis -I explained to the patient the etiology of porokeratosis and various treatment options were asked discussed.  I explained to her that given the pain that she is having with range of motion of the fifth metatarsophalangeal joint.  There is probably inflammation component associated with pain.  I believe she will benefit from a steroid injection.  Patient agrees with the plan would like to proceed with steroid injection.  I will also plan on doing aggressive debridement with excision of the central nucleated core. -Using chisel blade and a handle the nucleated core was excised out with relief of pain. -A second steroid injection was performed at left fifth MPJ using 1% plain Lidocaine and 10 mg of Kenalog. This was well tolerated.   Onychomycosis with pain  -Nails palliatively debrided as below. -Educated on self-care  Procedure: Nail Debridement Rationale: pain  Type of Debridement: manual, sharp debridement. Instrumentation: Nail nipper, rotary burr. Number of Nails: 10  Procedures and Treatment: Consent by patient was obtained for treatment procedures. The patient understood the discussion of treatment and procedures well. All questions were answered thoroughly reviewed. Debridement of mycotic and hypertrophic toenails, 1 through 5 bilateral and clearing of subungual debris. No ulceration, no infection noted.  Return Visit-Office Procedure: Patient instructed to return to the office for a follow up visit 3 months for continued evaluation and treatment.  Nicholes Rough, DPM    No follow-ups on file. Patient

## 2020-07-21 ENCOUNTER — Encounter: Payer: Self-pay | Admitting: Podiatry

## 2020-07-21 ENCOUNTER — Ambulatory Visit: Payer: Medicare Other | Admitting: Podiatry

## 2020-07-21 ENCOUNTER — Other Ambulatory Visit: Payer: Self-pay

## 2020-07-21 ENCOUNTER — Ambulatory Visit (INDEPENDENT_AMBULATORY_CARE_PROVIDER_SITE_OTHER): Payer: Medicare Other

## 2020-07-21 ENCOUNTER — Encounter: Payer: Self-pay | Admitting: *Deleted

## 2020-07-21 DIAGNOSIS — B070 Plantar wart: Secondary | ICD-10-CM

## 2020-07-21 DIAGNOSIS — S92325A Nondisplaced fracture of second metatarsal bone, left foot, initial encounter for closed fracture: Secondary | ICD-10-CM | POA: Diagnosis not present

## 2020-07-21 DIAGNOSIS — M7752 Other enthesopathy of left foot: Secondary | ICD-10-CM | POA: Diagnosis not present

## 2020-07-27 ENCOUNTER — Ambulatory Visit: Payer: Medicare Other | Admitting: Podiatry

## 2020-08-04 NOTE — Progress Notes (Signed)
   HPI: 70 y.o. female presenting today for 2 separate complaints.  Patient states that a few weeks ago while working a spool of yarn fell on her foot, left.  Ever since then she has had some pain and tenderness and would like to have it evaluated.  She currently has not done anything for treatment.  It is painful when walking and when bending the toes.  Patient also states that she has pain and symptoms associated to a skin lesions to the plantar aspect of the fifth MTPJ of the left foot.  Is very symptomatic with walking and feels like there is a rock or sharp shooting sensation to the bottom of her foot.  She presents for further treatment and evaluation  Past Medical History:  Diagnosis Date  . Acid reflux   . Hypercholesteremia   . Hypertension      Physical Exam: General: The patient is alert and oriented x3 in no acute distress.  Dermatology: Skin is warm, dry and supple bilateral lower extremities. Negative for open lesions or macerations.  Hyperkeratotic skin lesion with pinpoint bleeding upon debridement noted to the septic MTPJ of the left foot  Vascular: Palpable pedal pulses bilaterally. No edema or erythema noted. Capillary refill within normal limits.  Neurological: Epicritic and protective threshold grossly intact bilaterally.   Musculoskeletal Exam: No pedal deformities noted.  There is pain on palpation directly overlying the second metatarsal of the left foot  Radiographic Exam:  Normal osseous mineralization. Joint spaces preserved.  Closed, nondisplaced fracture noted to the second metatarsal left  Assessment: 1.  Fracture second metatarsal left, closed, nondisplaced, initial encounter 2.  Plantar verruca subfifth MTPJ left   Plan of Care:  1. Patient evaluated. X-Rays reviewed.  2.  In regards to the second metatarsal fracture, a cam boot was dispensed.  She may be light weightbearing as tolerated 3.  Note for work was provided today.  Patient may continue work  with restrictions.  Rest as needed.  No extended periods of standing or walking.  No lifting.  Sedentary work only 4.  Excisional debridement of the plantar verruca was performed using a 312 scalpel.  Pinpoint bleeding noted.  Salicylic acid applied.  Recommend OTC salicylic acid daily 5.  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       Felecia Shelling, DPM [facility]  Dr. Felecia Shelling, DPM    [address] [place], [place] [address]                Office [phone]  Fax [phone]

## 2020-08-18 ENCOUNTER — Ambulatory Visit (INDEPENDENT_AMBULATORY_CARE_PROVIDER_SITE_OTHER): Payer: Medicare Other | Admitting: Podiatry

## 2020-08-18 ENCOUNTER — Other Ambulatory Visit: Payer: Self-pay

## 2020-08-18 ENCOUNTER — Ambulatory Visit (INDEPENDENT_AMBULATORY_CARE_PROVIDER_SITE_OTHER): Payer: Medicare Other

## 2020-08-18 ENCOUNTER — Encounter: Payer: Self-pay | Admitting: Podiatry

## 2020-08-18 DIAGNOSIS — S92325A Nondisplaced fracture of second metatarsal bone, left foot, initial encounter for closed fracture: Secondary | ICD-10-CM

## 2020-08-18 NOTE — Progress Notes (Signed)
   HPI: 70 y.o. female presenting today for follow-up evaluation of a metatarsal fracture to the left foot.  She has been weightbearing in the cam boot.  She states that she feels much better.  She has been working with restrictions.  No new complaints at this time  Past Medical History:  Diagnosis Date   Acid reflux    Hypercholesteremia    Hypertension      Physical Exam: General: The patient is alert and oriented x3 in no acute distress.  Dermatology: Skin is warm, dry and supple bilateral lower extremities. Negative for open lesions or macerations.  Vascular: Palpable pedal pulses bilaterally. No edema or erythema noted. Capillary refill within normal limits.  Neurological: Epicritic and protective threshold grossly intact bilaterally.   Musculoskeletal Exam: No pedal deformities noted.  Minimal pain on palpation directly overlying the second metatarsal of the left foot  Radiographic Exam:  Normal osseous mineralization. Joint spaces preserved.  Closed, nondisplaced fracture noted to the second metatarsal left with callus formation around the fracture site and demonstration of good healing  Assessment: 1.  Fracture second metatarsal left, closed, nondisplaced, initial encounter 2.  Plantar verruca subfifth MTPJ left; resolved   Plan of Care:  1. Patient evaluated. X-Rays reviewed.  2.  Continue work restrictions.  Patient may continue work with restrictions.  Rest as needed.  No extended periods of standing or walking.  No lifting.  Sedentary work only 3.  Patient may discontinue cam boot.  Postsurgical shoe dispensed.  Weightbearing as tolerated 4.  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       Felecia Shelling, DPM [facility]  Dr. Felecia Shelling, DPM    [address] [place], [place] [address]                 Office [phone]  Fax [phone]

## 2020-09-08 HISTORY — PX: KNEE SURGERY: SHX244

## 2020-09-12 ENCOUNTER — Ambulatory Visit: Payer: Medicare Other | Admitting: Podiatry

## 2020-09-19 ENCOUNTER — Ambulatory Visit: Payer: Medicare Other | Admitting: Podiatry

## 2020-09-19 ENCOUNTER — Ambulatory Visit (INDEPENDENT_AMBULATORY_CARE_PROVIDER_SITE_OTHER): Payer: Medicare Other

## 2020-09-19 ENCOUNTER — Other Ambulatory Visit: Payer: Self-pay

## 2020-09-19 ENCOUNTER — Ambulatory Visit: Payer: Medicare Other

## 2020-09-19 DIAGNOSIS — S92325A Nondisplaced fracture of second metatarsal bone, left foot, initial encounter for closed fracture: Secondary | ICD-10-CM

## 2020-09-19 NOTE — Progress Notes (Signed)
   HPI: 70 y.o. female presenting today for follow-up evaluation of a metatarsal fracture to the left foot.  Patient has been weightbearing in the postsurgical shoe.  She has no pain and no irritation with the foot.  She is ready to go back to work. Past Medical History:  Diagnosis Date   Acid reflux    Hypercholesteremia    Hypertension      Physical Exam: General: The patient is alert and oriented x3 in no acute distress.  Dermatology: Skin is warm, dry and supple bilateral lower extremities. Negative for open lesions or macerations.  Vascular: Palpable pedal pulses bilaterally. No edema or erythema noted. Capillary refill within normal limits.  Neurological: Epicritic and protective threshold grossly intact bilaterally.   Musculoskeletal Exam: No pedal deformities noted.  Negative for any pain on palpation directly overlying the second metatarsal of the left foot  Radiographic Exam:  Normal osseous mineralization. Joint spaces preserved.  Closed, nondisplaced fracture noted to the second metatarsal left with callus formation around the fracture site and demonstration of good healing  Assessment: 1.  Fracture second metatarsal left, closed, nondisplaced, subsequent encounter with routine healing Plan of Care:  1. Patient evaluated. X-Rays reviewed again today.  2.  Patient has no pain or sensitivity and she is anxious to return to work. 3.  Clinically the patient is very asymptomatic.  She may resume work full activity no restrictions 4.  Recommend good supportive shoes and sneakers 5.  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  *Having right knee surgery this Thursday, 09/21/2020      Felecia Shelling, DPM [facility]  Dr. Felecia Shelling, DPM    [address] [place], [place] [address]                Office [phone]  Fax [phone]

## 2020-09-28 ENCOUNTER — Other Ambulatory Visit: Payer: Self-pay | Admitting: Physician Assistant

## 2020-09-28 ENCOUNTER — Ambulatory Visit
Admission: RE | Admit: 2020-09-28 | Discharge: 2020-09-28 | Disposition: A | Discharge status: Home / Self Care | Payer: Medicare Other | Source: Ambulatory Visit | Attending: Physician Assistant | Admitting: Physician Assistant

## 2020-09-28 ENCOUNTER — Other Ambulatory Visit: Payer: Self-pay

## 2020-09-28 DIAGNOSIS — R202 Paresthesia of skin: Secondary | ICD-10-CM | POA: Insufficient documentation

## 2020-09-28 DIAGNOSIS — M79605 Pain in left leg: Secondary | ICD-10-CM | POA: Diagnosis present

## 2020-10-23 ENCOUNTER — Encounter: Payer: Self-pay | Admitting: Emergency Medicine

## 2020-10-23 ENCOUNTER — Other Ambulatory Visit: Payer: Self-pay

## 2020-10-23 ENCOUNTER — Emergency Department
Admission: EM | Admit: 2020-10-23 | Discharge: 2020-10-23 | Disposition: A | Discharge status: Home / Self Care | Payer: Medicare Other | Attending: Emergency Medicine | Admitting: Emergency Medicine

## 2020-10-23 DIAGNOSIS — B9689 Other specified bacterial agents as the cause of diseases classified elsewhere: Secondary | ICD-10-CM | POA: Diagnosis not present

## 2020-10-23 DIAGNOSIS — R110 Nausea: Secondary | ICD-10-CM

## 2020-10-23 DIAGNOSIS — Z79899 Other long term (current) drug therapy: Secondary | ICD-10-CM | POA: Diagnosis not present

## 2020-10-23 DIAGNOSIS — E039 Hypothyroidism, unspecified: Secondary | ICD-10-CM | POA: Insufficient documentation

## 2020-10-23 DIAGNOSIS — E876 Hypokalemia: Secondary | ICD-10-CM | POA: Diagnosis not present

## 2020-10-23 DIAGNOSIS — I10 Essential (primary) hypertension: Secondary | ICD-10-CM | POA: Insufficient documentation

## 2020-10-23 DIAGNOSIS — N3000 Acute cystitis without hematuria: Secondary | ICD-10-CM

## 2020-10-23 DIAGNOSIS — R350 Frequency of micturition: Secondary | ICD-10-CM | POA: Diagnosis present

## 2020-10-23 LAB — LIPASE, BLOOD: Lipase: 24 U/L (ref 11–51)

## 2020-10-23 LAB — CBC
HCT: 37.1 % (ref 36.0–46.0)
Hemoglobin: 13 g/dL (ref 12.0–15.0)
MCH: 31.3 pg (ref 26.0–34.0)
MCHC: 35 g/dL (ref 30.0–36.0)
MCV: 89.4 fL (ref 80.0–100.0)
Platelets: 317 10*3/uL (ref 150–400)
RBC: 4.15 MIL/uL (ref 3.87–5.11)
RDW: 12.8 % (ref 11.5–15.5)
WBC: 6.4 10*3/uL (ref 4.0–10.5)
nRBC: 0 % (ref 0.0–0.2)

## 2020-10-23 LAB — URINALYSIS, COMPLETE (UACMP) WITH MICROSCOPIC
Bilirubin Urine: NEGATIVE
Glucose, UA: NEGATIVE mg/dL
Ketones, ur: NEGATIVE mg/dL
Nitrite: NEGATIVE
Protein, ur: NEGATIVE mg/dL
Specific Gravity, Urine: 1.015 (ref 1.005–1.030)
WBC, UA: >50 WBC/hpf — ABNORMAL HIGH (ref 0–5)
pH: 6 (ref 5.0–8.0)

## 2020-10-23 LAB — COMPREHENSIVE METABOLIC PANEL
ALT: 11 U/L (ref 0–44)
AST: 15 U/L (ref 15–41)
Albumin: 3.8 g/dL (ref 3.5–5.0)
Alkaline Phosphatase: 64 U/L (ref 38–126)
Anion gap: 12 (ref 5–15)
BUN: 8 mg/dL (ref 8–23)
CO2: 28 mmol/L (ref 22–32)
Calcium: 9.8 mg/dL (ref 8.9–10.3)
Chloride: 99 mmol/L (ref 98–111)
Creatinine, Ser: 0.8 mg/dL (ref 0.44–1.00)
GFR, Estimated: >60 mL/min (ref >60)
Glucose, Bld: 158 mg/dL — ABNORMAL HIGH (ref 70–99)
Potassium: 3 mmol/L — ABNORMAL LOW (ref 3.5–5.1)
Sodium: 139 mmol/L (ref 135–145)
Total Bilirubin: 0.8 mg/dL (ref 0.3–1.2)
Total Protein: 7.2 g/dL (ref 6.5–8.1)

## 2020-10-23 MED ORDER — CEPHALEXIN 500 MG PO CAPS: 500.0000 mg | ORAL_CAPSULE | Freq: Four times a day (QID) | ORAL | 0 refills | Status: AC

## 2020-10-23 MED ORDER — POTASSIUM CHLORIDE CRYS ER 20 MEQ PO TBCR
40.0000 meq | EXTENDED_RELEASE_TABLET | Freq: Once | ORAL | Status: AC
  Administered 2020-10-23: 40 meq via ORAL
  Filled 2020-10-23: qty 2

## 2020-10-23 MED ORDER — ONDANSETRON 4 MG PO TBDP: 4.0000 mg | ORAL_TABLET | Freq: Three times a day (TID) | ORAL | 0 refills | Status: DC | PRN

## 2020-10-23 MED ORDER — ONDANSETRON 4 MG PO TBDP
4.0000 mg | ORAL_TABLET | Freq: Once | ORAL | Status: AC
  Administered 2020-10-23: 4 mg via ORAL
  Filled 2020-10-23: qty 1

## 2020-10-23 MED ORDER — CEPHALEXIN 500 MG PO CAPS
500.0000 mg | ORAL_CAPSULE | Freq: Once | ORAL | Status: AC
  Administered 2020-10-23: 500 mg via ORAL
  Filled 2020-10-23: qty 1

## 2020-10-23 NOTE — ED Provider Notes (Signed)
[facility] Emergency Department Provider Note ____________________________________________   Event Date/Time   First MD Initiated Contact with Patient 10/23/20 1030     (approximate)  I have reviewed the triage vital signs and the nursing notes.  HISTORY  Chief Complaint Nausea   HPI Lynn Paul is a 70 y.o. femalewho presents to the ED for evaluation of nausea.   Chart review indicates hx HTN, HLD, GERD.   Patient presents to the ED for evaluation of 3-4 days of nausea and urinary frequency.  She denies any abdominal pain, dysuria, fevers, vaginal discharge or bleeding.   She does report associated 1 episode of nonbloody nonbilious emesis "a few days ago."  As well as an episode of watery diarrhea this morning.  Denies melena or hematochezia.  Reports continued nausea and poor p.o. intake due to lack of appetite.  She denies fevers or chills to me, despite being triaged with having some chills.  She reports a right total knee replacement about 1 month ago but has been doing well.  Has been ambulatory on this without issues.  Denies wound dehiscence, erythema.   Past Medical History:  Diagnosis Date   Acid reflux    Hypercholesteremia    Hypertension     Patient Active Problem List   Diagnosis Date Noted   Post-menopausal bleeding 12/20/2019   Tobacco abuse 06/09/2019   Abdominal fullness 03/18/2017   Gastroesophageal reflux disease without esophagitis 08/24/2015   Prediabetes 08/24/2015   Abnormal mammogram 08/25/2012   Hyperlipidemia 01/17/2012   Hypothyroidism 07/02/2010   Hypertension, benign 11/21/2008   Osteoarthritis 11/21/2008    History reviewed. No pertinent surgical history.  Prior to Admission medications   Medication Sig Start Date End Date Taking? Authorizing Provider  cephALEXin (KEFLEX) 500 MG capsule Take 1 capsule (500 mg total) by mouth 4 (four) times daily for 5 days. 10/23/20 10/28/20 Yes Delton Prairie, MD   ondansetron (ZOFRAN ODT) 4 MG disintegrating tablet Take 1 tablet (4 mg total) by mouth every 8 (eight) hours as needed for nausea or vomiting. 10/23/20  Yes Delton Prairie, MD  amLODipine (NORVASC) 5 MG tablet Take 5 mg by mouth 2 (two) times daily. 08/19/19   [provider]  cholecalciferol (VITAMIN D) 1000 UNITS tablet Take 1,000 Units by mouth daily.    [provider]  cimetidine (TAGAMET) 300 MG tablet Take 1 tablet (300 mg total) by mouth 2 (two) times daily. 10/17/14   Beers, Charmayne Sheer, PA-C  hydrochlorothiazide (HYDRODIURIL) 25 MG tablet Take 25 mg by mouth daily.    [provider]  hydrOXYzine (VISTARIL) 25 MG capsule Take 1 capsule (25 mg total) by mouth 3 (three) times daily as needed. 10/17/14   Beers, Charmayne Sheer, PA-C  levothyroxine (SYNTHROID, LEVOTHROID) 75 MCG tablet Take 75 mcg by mouth daily before breakfast.    [provider]  lovastatin (MEVACOR) 40 MG tablet Take 40 mg by mouth daily. 09/13/19   [provider]  omeprazole (PRILOSEC) 20 MG capsule Take 20 mg by mouth daily.    [provider]  traMADol (ULTRAM) 50 MG tablet Take 50 mg by mouth every 6 (six) hours as needed. 07/13/20   [provider]  traMADol HCl 100 MG TABS tramadol 100 mg tablet  TAKE 1 TABLET BY MOUTH EVERY 6 HOURS AS NEEDED    [provider]    Allergies Patient has no known allergies.  Family History  Problem Relation Age of Onset   Colon cancer Brother  37   Breast cancer Daughter 109    Social History Social History   Tobacco Use   Smoking status: Never   Smokeless tobacco: Never  Vaping Use   Vaping Use: Never used  Substance Use Topics   Alcohol use: No   Drug use: No    Review of Systems  Constitutional: No fever/chills Eyes: No visual changes. ENT: No sore throat. Cardiovascular: Denies chest pain. Respiratory: Denies shortness of breath. Gastrointestinal: No abdominal pain.  No constipation. Positive for  nausea, vomiting and diarrhea Genitourinary: Negative for dysuria.  Positive urinary frequency Musculoskeletal: Negative for back pain. Skin: Negative for rash. Neurological: Negative for headaches, focal weakness or numbness.  ____________________________________________   PHYSICAL EXAM:  VITAL SIGNS: Vitals:   10/23/20 1002  BP: 126/65  Pulse: 72  Resp: 16  Temp: 98.5 F (36.9 C)  SpO2: 100%     Constitutional: Alert and oriented. Well appearing and in no acute distress. Eyes: Conjunctivae are normal. PERRL. EOMI. Head: Atraumatic. Nose: No congestion/rhinnorhea. Mouth/Throat: Mucous membranes are moist.  Oropharynx non-erythematous. Neck: No stridor. No cervical spine tenderness to palpation. Cardiovascular: Normal rate, regular rhythm. Grossly normal heart sounds.  Good peripheral circulation. Respiratory: Normal respiratory effort.  No retractions. Lungs CTAB. Gastrointestinal: Soft , nondistended, nontender to palpation. No CVA tenderness. Musculoskeletal: No lower extremity tenderness nor edema.  No joint effusions. No signs of acute trauma. Well-healing midline vertical incision to the right knee without superimposed erythema, induration or wound dehiscence. Neurologic:  Normal speech and language. No gross focal neurologic deficits are appreciated. No gait instability noted. Skin:  Skin is warm, dry and intact. No rash noted. Psychiatric: Mood and affect are normal. Speech and behavior are normal.  ____________________________________________   LABS (all labs ordered are listed, but only abnormal results are displayed)  Labs Reviewed  COMPREHENSIVE METABOLIC PANEL - Abnormal; Notable for the following components:      Result Value   Potassium 3.0 (*)    Glucose, Bld 158 (*)    All other components within normal limits  URINALYSIS, COMPLETE (UACMP) WITH MICROSCOPIC - Abnormal; Notable for the following components:   Color, Urine YELLOW (*)    APPearance  CLOUDY (*)    Hgb urine dipstick SMALL (*)    Leukocytes,Ua LARGE (*)    WBC, UA >50 (*)    Bacteria, UA RARE (*)    All other components within normal limits  URINE CULTURE  LIPASE, BLOOD  CBC   ____________________________________________  12 Lead EKG   ____________________________________________  RADIOLOGY  ED MD interpretation:    Official radiology report(s): No results found.  ____________________________________________   PROCEDURES and INTERVENTIONS  Procedure(s) performed (including Critical Care):  Procedures  Medications  ondansetron (ZOFRAN-ODT) disintegrating tablet 4 mg (4 mg Oral Given 10/23/20 1052)  potassium chloride SA (KLOR-CON) CR tablet 40 mEq (40 mEq Oral Given 10/23/20 1052)  cephALEXin (KEFLEX) capsule 500 mg (500 mg Oral Given 10/23/20 1053)    ____________________________________________   MDM / ED COURSE   Pleasant 70 year old woman presents to the ED with nausea and urinary frequency, with evidence of acute cystitis amenable to outpatient management.  Normal vitals on room air.  Exam is reassuring without evidence of abdominal tenderness or pain.  She looks clinically well without distress.  Well-healing TKR from 1 month ago.  Blood work with mild hypokalemia, that was repleted orally.  No evidence of sepsis, pancreatitis, stress or further acute pathology.  We will send her urine for culture and initiate  treatment with Keflex.  Provided Zofran for symptomatic measures and we discussed return precautions prior to discharge.     ____________________________________________   FINAL CLINICAL IMPRESSION(S) / ED DIAGNOSES  Final diagnoses:  Nausea  Urinary frequency  Hypokalemia  Acute cystitis without hematuria     ED Discharge Orders          Ordered    cephALEXin (KEFLEX) 500 MG capsule  4 times daily        10/23/20 1105    ondansetron (ZOFRAN ODT) 4 MG disintegrating tablet  Every 8 hours PRN        10/23/20 1105              Alontae Chaloux   Note:  This document was prepared using Conservation officer, historic buildings and may include unintentional dictation errors.    Delton Prairie, MD 10/23/20 [phone]

## 2020-10-23 NOTE — ED Notes (Signed)
See triage note  Presents with some n/v/d and chills  States sx's started about 4 days ago  No vomiting today   Taking sips of fluids  Tolerated well   Afebrile on arrival

## 2020-10-23 NOTE — Discharge Instructions (Addendum)
Use Tylenol for pain and fevers.  Up to 1000 mg per dose, up to 4 times per day.  Do not take more than 4000 mg of Tylenol/acetaminophen within 24 hours..  Use the Zofran as needed for any further nausea.  Use the Keflex antibiotic 4 times daily for the next 5 days to treat a UTI/bladder infection.

## 2020-10-23 NOTE — ED Triage Notes (Signed)
First Nurse Note:  C/O 3 day history of nausea.  Also states has had a few episodes of diarrhea and intermittent chills.  AAOx3.  Skin warm and dry.  No SOB/ DOE.  NAD

## 2020-10-24 LAB — URINE CULTURE: Culture: <10000 — AB

## 2020-11-28 ENCOUNTER — Ambulatory Visit: Payer: Medicare Other | Admitting: Podiatry

## 2021-03-13 ENCOUNTER — Other Ambulatory Visit: Payer: Self-pay

## 2021-03-13 ENCOUNTER — Ambulatory Visit: Payer: Medicare Other | Admitting: Obstetrics and Gynecology

## 2021-03-13 ENCOUNTER — Encounter: Payer: Self-pay | Admitting: Obstetrics and Gynecology

## 2021-03-13 VITALS — BP 130/64 | Ht 65.0 in | Wt 159.0 lb

## 2021-03-13 DIAGNOSIS — R399 Unspecified symptoms and signs involving the genitourinary system: Secondary | ICD-10-CM | POA: Diagnosis not present

## 2021-03-13 DIAGNOSIS — R102 Pelvic and perineal pain: Secondary | ICD-10-CM | POA: Diagnosis not present

## 2021-03-13 LAB — POCT URINALYSIS DIPSTICK
Bilirubin, UA: NEGATIVE
Blood, UA: NEGATIVE
Glucose, UA: NEGATIVE
Ketones, UA: NEGATIVE
Nitrite, UA: NEGATIVE
Protein, UA: NEGATIVE
Spec Grav, UA: 1.015 (ref 1.010–1.025)
pH, UA: 5 (ref 5.0–8.0)

## 2021-03-13 MED ORDER — NITROFURANTOIN MONOHYD MACRO 100 MG PO CAPS: 100.0000 mg | ORAL_CAPSULE | Freq: Two times a day (BID) | ORAL | 0 refills | Status: AC

## 2021-03-13 NOTE — Progress Notes (Signed)
Pcp, No   Chief Complaint  Patient presents with   Pelvic Pain    Center, lower back pain x since Saturday   Urinary Tract Infection    Frequent urination, no burning since Sunday    HPI:      [name] is a 71 y.o. G2P2002 whose LMP was No LMP recorded. Patient is postmenopausal., presents today for urinary frequency with little flow, urgency for several days; also having cramping pelvic pain, hurts to cross legs, stand up/sit down and radiates to rectal area; and LBP, no sciatica, without recent heavy lifting. No hematuria, urine odor. Hx of UTIs in past, feels different. Started leftover abx yesterday (doesn't know which kind) and took 2 doses with some improvement. Also with hx of constipation, usually requiring her to take meds to help. Has been worse past few days but hasn't taken med yet.  No vag sx, no PMB. Hx of PMB 11/21 with neg GYN u/s except few leio. Not sex active.    Patient Active Problem List   Diagnosis Date Noted   Post-menopausal bleeding 12/20/2019   Tobacco abuse 06/09/2019   Abdominal fullness 03/18/2017   Gastroesophageal reflux disease without esophagitis 08/24/2015   Prediabetes 08/24/2015   Abnormal mammogram 08/25/2012   Hyperlipidemia 01/17/2012   Hypothyroidism 07/02/2010   Hypertension, benign 11/21/2008   Osteoarthritis 11/21/2008    Past Surgical History:  Procedure Laterality Date   KNEE SURGERY  09/2020    Family History  Problem Relation Age of Onset   Colon cancer Brother 51   Breast cancer Daughter 32    Social History   Socioeconomic History   Marital status: Widowed    Spouse name: Not on file   Number of children: Not on file   Years of education: Not on file   Highest education level: Not on file  Occupational History   Not on file  Tobacco Use   Smoking status: Never   Smokeless tobacco: Never  Vaping Use   Vaping Use: Never used  Substance and Sexual Activity   Alcohol use: No   Drug use: No    Sexual activity: Not Currently    Birth control/protection: Post-menopausal  Other Topics Concern   Not on file  Social History Narrative   Not on file   Social Determinants of Health   Financial Resource Strain: Not on file  Food Insecurity: Not on file  Transportation Needs: Not on file  Physical Activity: Not on file  Stress: Not on file  Social Connections: Not on file  Intimate Partner Violence: Not on file    Outpatient Medications Prior to Visit  Medication Sig Dispense Refill   amLODipine (NORVASC) 5 MG tablet Take 5 mg by mouth 2 (two) times daily.     cholecalciferol (VITAMIN D) 1000 UNITS tablet Take 1,000 Units by mouth daily.     hydrochlorothiazide (HYDRODIURIL) 25 MG tablet Take 25 mg by mouth daily.     hydrOXYzine (VISTARIL) 25 MG capsule Take 1 capsule (25 mg total) by mouth 3 (three) times daily as needed. 30 capsule 0   levothyroxine (SYNTHROID, LEVOTHROID) 75 MCG tablet Take 75 mcg by mouth daily before breakfast.     lovastatin (MEVACOR) 40 MG tablet Take 40 mg by mouth daily.     naproxen sodium (ANAPROX) 550 MG tablet SMARTSIG:1 Tablet(s) By Mouth Every 12 Hours PRN     omeprazole (PRILOSEC) 20 MG capsule Take 20 mg by mouth daily.     traMADol (  ULTRAM) 50 MG tablet Take 50 mg by mouth every 6 (six) hours as needed.     traMADol HCl 100 MG TABS tramadol 100 mg tablet  TAKE 1 TABLET BY MOUTH EVERY 6 HOURS AS NEEDED     cimetidine (TAGAMET) 300 MG tablet Take 1 tablet (300 mg total) by mouth 2 (two) times daily. 14 tablet 0   ondansetron (ZOFRAN ODT) 4 MG disintegrating tablet Take 1 tablet (4 mg total) by mouth every 8 (eight) hours as needed for nausea or vomiting. 20 tablet 0   No facility-administered medications prior to visit.      ROS:  Review of Systems  Constitutional:  Negative for fever.  Gastrointestinal:  Negative for blood in stool, constipation, diarrhea, nausea and vomiting.  Genitourinary:  Positive for frequency, pelvic pain and  urgency. Negative for dyspareunia, dysuria, flank pain, hematuria, vaginal bleeding, vaginal discharge and vaginal pain.  Musculoskeletal:  Positive for back pain.  Skin:  Negative for rash.  BREAST: No symptoms   OBJECTIVE:   Vitals:  BP 130/64    Ht 5\' 5"  (1.651 m)    Wt 159 lb (72.1 kg)    BMI 26.46 kg/m   Physical Exam Vitals reviewed.  Constitutional:      Appearance: She is well-developed.  Pulmonary:     Effort: Pulmonary effort is normal.  Abdominal:     Palpations: Abdomen is soft.     Tenderness: There is abdominal tenderness in the suprapubic area. There is no right CVA tenderness or left CVA tenderness.  Genitourinary:    General: Normal vulva.     Pubic Area: No rash.      Labia:        Right: No rash, tenderness or lesion.        Left: No rash, tenderness or lesion.      Vagina: Normal. No vaginal discharge, erythema or tenderness.     Cervix: Normal.     Uterus: Normal. Tender. Not enlarged.      Adnexa: Right adnexa normal and left adnexa normal.       Right: No mass or tenderness.         Left: No mass or tenderness.    Musculoskeletal:        General: Normal range of motion.     Cervical back: Normal range of motion.  Skin:    General: Skin is warm and dry.  Neurological:     General: No focal deficit present.     Mental Status: She is alert and oriented to person, place, and time.  Psychiatric:        Mood and Affect: Mood normal.        Behavior: Behavior normal.        Thought Content: Thought content normal.        Judgment: Judgment normal.    Results: Results for orders placed or performed in visit on 03/13/21 (from the past 24 hour(s))  POCT Urinalysis Dipstick     Status: Abnormal   Collection Time: 03/13/21 11:08 AM  Result Value Ref Range   Color, UA yellow    Clarity, UA clear    Glucose, UA Negative Negative   Bilirubin, UA neg    Ketones, UA neg    Spec Grav, UA 1.015 1.010 - 1.025   Blood, UA neg    pH, UA 5.0 5.0 - 8.0    Protein, UA Negative Negative   Urobilinogen, UA     Nitrite, UA neg  Leukocytes, UA Small (1+) (A) Negative   Appearance     Odor       Assessment/Plan: UTI symptoms - Plan: nitrofurantoin, macrocrystal-monohydrate, (MACROBID) 100 MG capsule, POCT Urinalysis Dipstick, Urine Culture; pos sx and UA. Pt took 2 abx doses yesterday. Rx macrobid, check C&S. Will f/u if neg. Increase water.   Pelvic pain--tender on exam. Question UTI. Treat for constipation in meantime as well. Will check GYN u/s if C&S neg and sx continue. F/u prn.    Meds ordered this encounter  Medications   nitrofurantoin, macrocrystal-monohydrate, (MACROBID) 100 MG capsule    Sig: Take 1 capsule (100 mg total) by mouth 2 (two) times daily for 5 days.    Dispense:  10 capsule    Refill:  0    Order Specific Question:   Supervising Provider    Answer:   Gae Dry [id number]      Return if symptoms worsen or fail to improve.  Joy Reiger B. Floris Neuhaus, PA-C 03/13/2021 11:10 AM

## 2021-03-15 ENCOUNTER — Telehealth: Payer: Self-pay

## 2021-03-15 LAB — URINE CULTURE: Organism ID, Bacteria: NO GROWTH

## 2021-03-15 NOTE — Telephone Encounter (Signed)
Patient is returning missed call. Please advise 

## 2021-08-03 ENCOUNTER — Ambulatory Visit (INDEPENDENT_AMBULATORY_CARE_PROVIDER_SITE_OTHER): Payer: Medicare Other

## 2021-08-03 ENCOUNTER — Ambulatory Visit: Payer: Medicare Other | Admitting: Podiatry

## 2021-08-03 ENCOUNTER — Encounter: Payer: Self-pay | Admitting: Podiatry

## 2021-08-03 DIAGNOSIS — M79675 Pain in left toe(s): Secondary | ICD-10-CM

## 2021-08-03 DIAGNOSIS — M2012 Hallux valgus (acquired), left foot: Secondary | ICD-10-CM | POA: Diagnosis not present

## 2021-08-03 DIAGNOSIS — M79674 Pain in right toe(s): Secondary | ICD-10-CM | POA: Diagnosis not present

## 2021-08-03 DIAGNOSIS — B351 Tinea unguium: Secondary | ICD-10-CM | POA: Diagnosis not present

## 2021-08-03 DIAGNOSIS — M7752 Other enthesopathy of left foot: Secondary | ICD-10-CM

## 2021-08-03 MED ORDER — BETAMETHASONE SOD PHOS & ACET 6 (3-3) MG/ML IJ SUSP: 3.0000 mg | Freq: Once | INTRAMUSCULAR | Status: AC

## 2021-08-03 MED ORDER — MELOXICAM 15 MG PO TABS: 15.0000 mg | ORAL_TABLET | Freq: Every day | ORAL | 1 refills | Status: AC

## 2021-08-03 NOTE — Progress Notes (Signed)
   Subjective: 71 y.o. female presents today for new complaint of pain to the left great toe joint.  Patient is concerned for bunion.  She says it is very painful and sensitive despite different shoe gear modifications.  She has had the bunion slowly developing for years.  She states that over the past few months it is become increasingly painful.  She has not done anything for treatment.  Patient is also requesting a nail trim today.  She states that her nails are very thick and painful and she is unable to trim her own nails.  She presents for further treatment and evaluation   Past Medical History:  Diagnosis Date   Acid reflux    Hypercholesteremia    Hypertension    Past Surgical History:  Procedure Laterality Date   KNEE SURGERY  09/2020   No Known Allergies    Objective: Physical Exam General: The patient is alert and oriented x3 in no acute distress.  Dermatology: Skin is cool, dry and supple bilateral lower extremities. Negative for open lesions or macerations.  Hyperkeratotic dystrophic nails noted 1-5 bilateral.  There is also a symptomatic callus lesion to the plantar aspect of the fifth MTP joint left foot secondary to a tailor's bunionette deformity  Vascular: Palpable pedal pulses bilaterally. No edema or erythema noted. Capillary refill within normal limits.  Neurological: Epicritic and protective threshold grossly intact bilaterally.   Musculoskeletal Exam: Clinical evidence of bunion deformity noted to the respective foot. There is moderate pain on palpation range of motion of the first MPJ. Lateral deviation of the hallux noted consistent with hallux abductovalgus.  Tailor's bunion deformity also noted with a prominent fifth metatarsal head left foot  Radiographic Exam: Increased intermetatarsal angle greater than 15 with a hallux abductus angle greater than 30 noted on AP view. Moderate degenerative changes noted within the first MPJ.  Assessment: 1. HAV w/  bunion deformity bilateral.  LT > RT  2.  Tailor's bunionette left 3.  Pain due to onychomycosis of toenails both 4.  Symptomatic callus plantar fifth MTP left   Plan of Care:  1. Patient was evaluated. X-Rays reviewed. 2.  Today we discussed different treatment options regarding bunion and tailor's bunionette deformities.  Both surgical and conservative.  Patient states that currently she is not in a position to have surgery and take time off of work.  We will pursue conservative treatment including wider shoe gears and arch supports 3.  Injection of 0.5 cc Celestone Soluspan injected first MTP left 4.  Excisional debridement of the hyperkeratotic callus tissue was performed with a 312 scalpel without incident or bleeding 5.  Mechanical debridement of nails 1-5 bilateral was performed using a nail nipper without incident 6.  Patient is considering surgery this winter.  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    Edrick Kins, DPM [facility]  Dr. Edrick Kins, DPM    [address], [place] [address]                Office [phone]  Fax [phone]

## 2021-09-18 IMAGING — US US EXTREM LOW VENOUS
1 series · 13 of 24 positions shown · non-contrast
Comparison: None.

CLINICAL DATA: Paresthesias of lower extremities, left lower
extremity pain and right lower extremity edema.



[Series 1: us venous img lower uni right (dvt) · portal-venous · 13 of 56 slices shown]
[im 1/56]
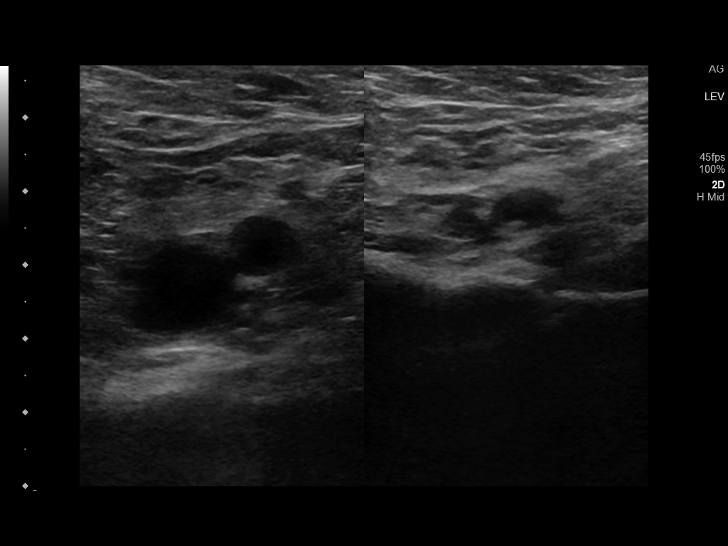
[im 5/56]
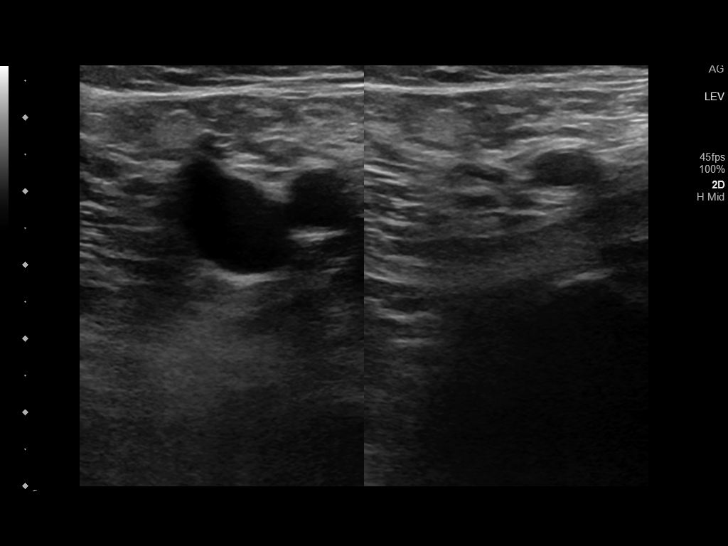
[im 10/56]
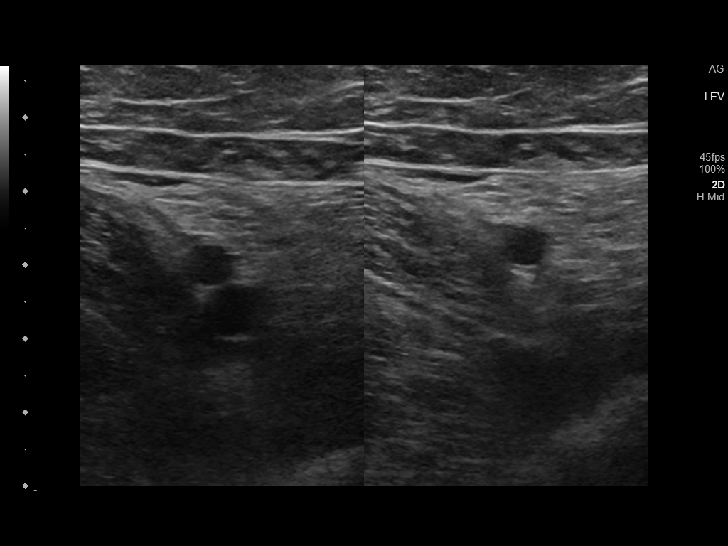
[im 15/56]
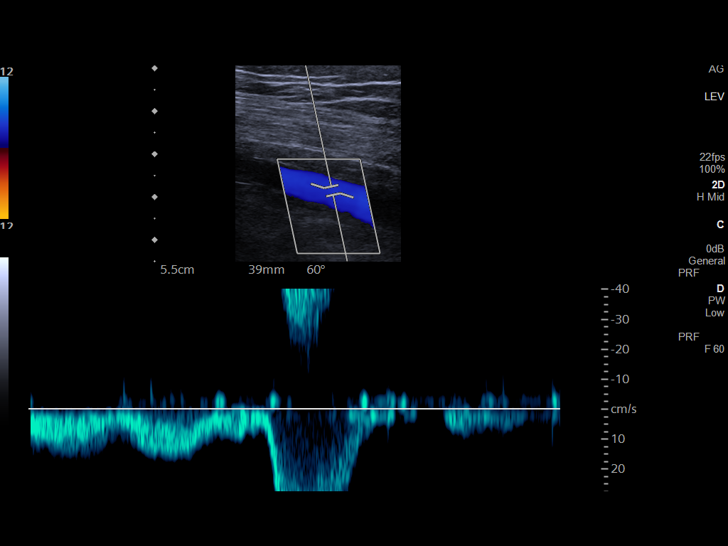
[im 20/56]
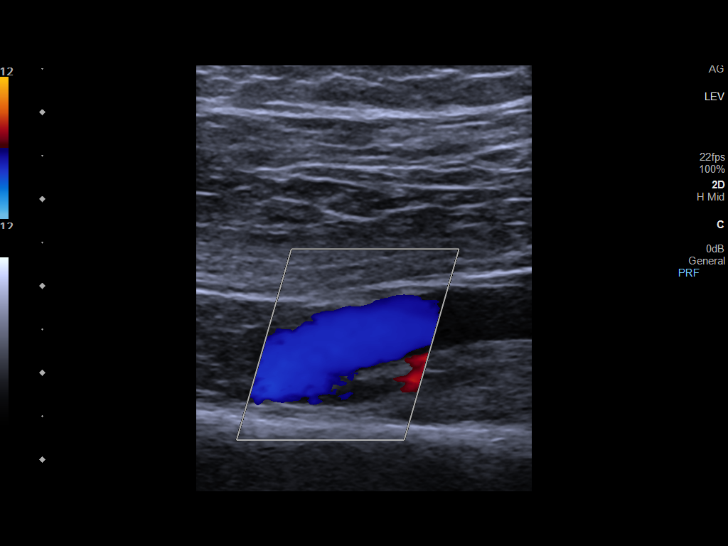
[im 24/56]
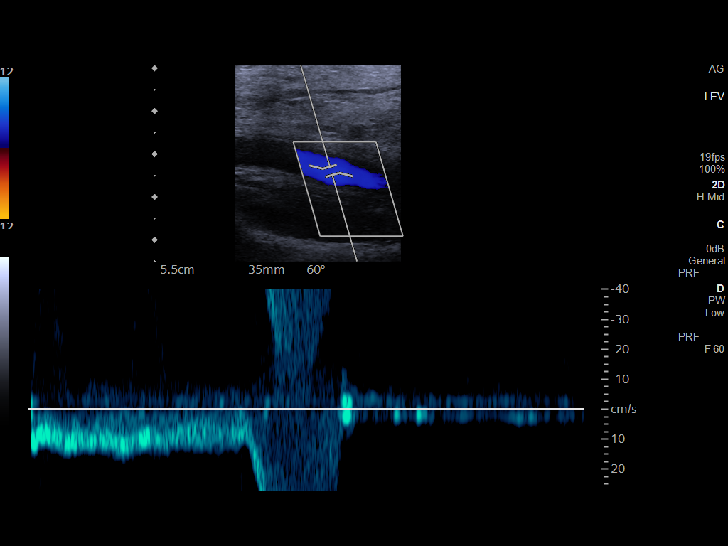
[im 29/56]
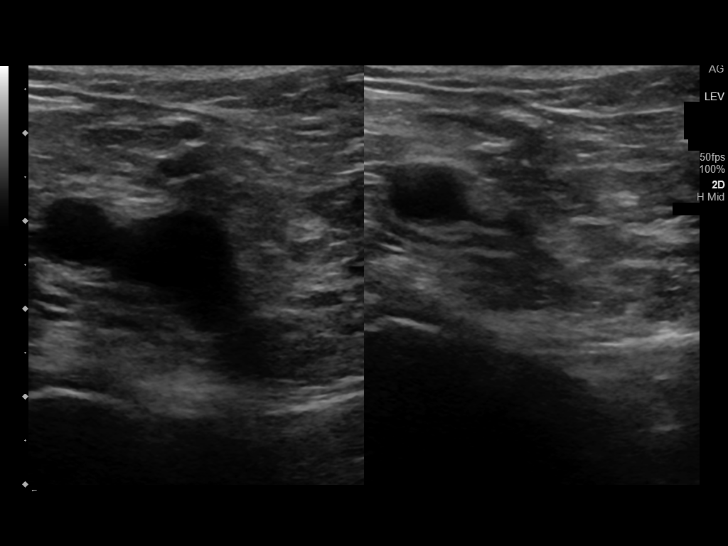
[im 32/56]
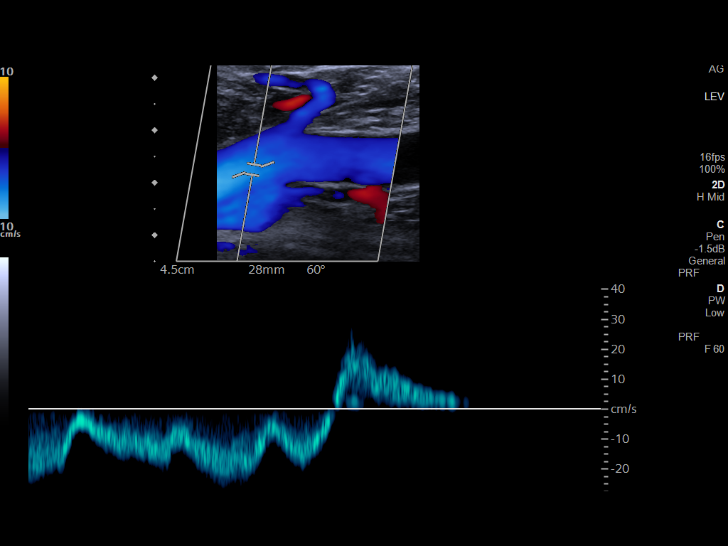
[im 36/56]
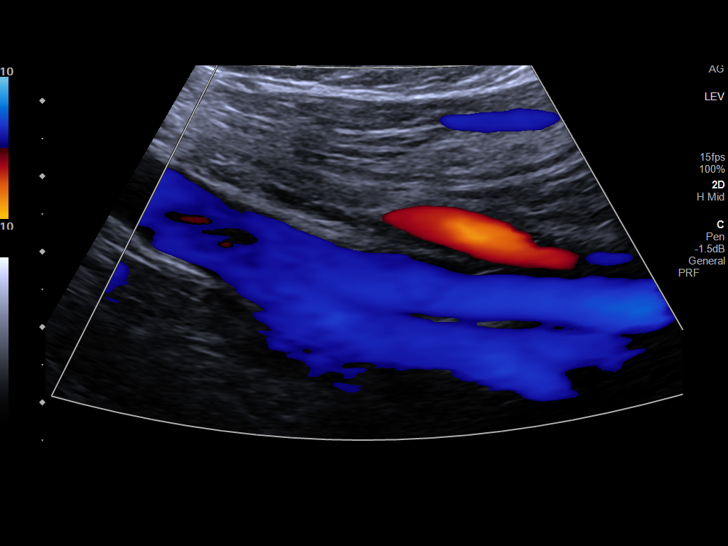
[im 41/56]
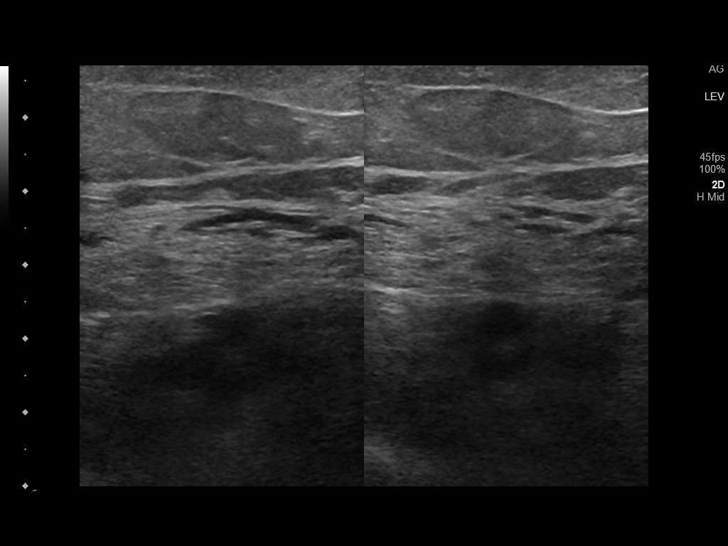
[im 46/56]
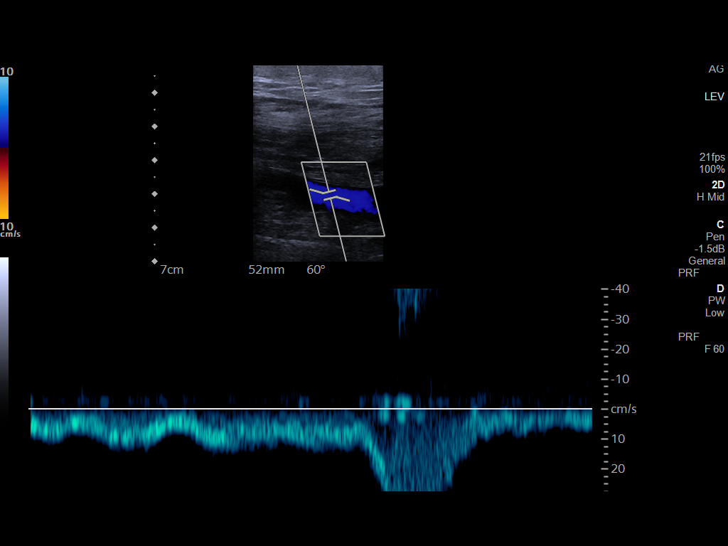
[im 51/56]
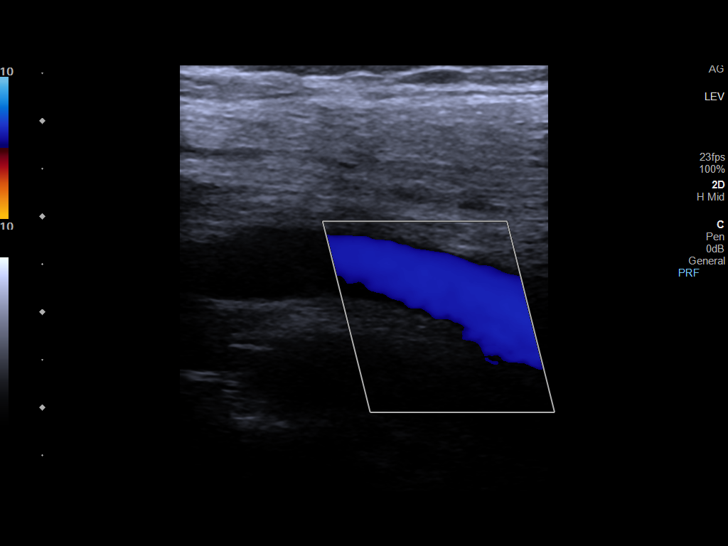
[im 56/56]
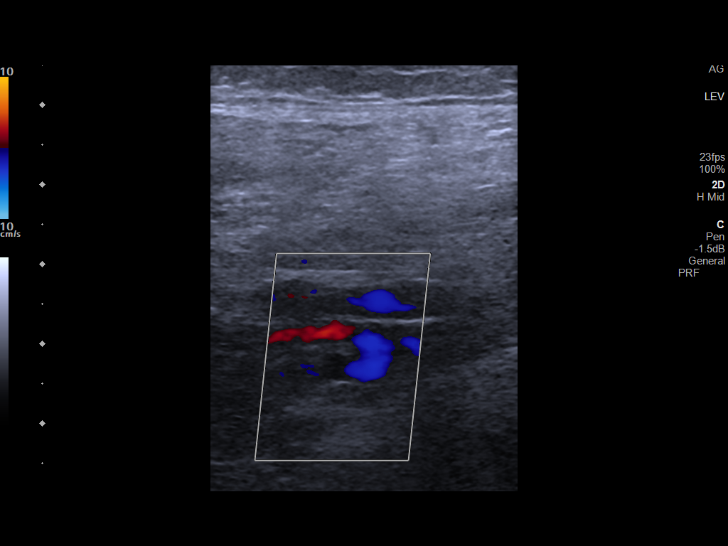

[13 of 24 positions shown; findings below may reference images not displayed]

FINDINGS: RIGHT LOWER EXTREMITY

Common Femoral Vein: No evidence of thrombus. Normal
compressibility, respiratory phasicity and response to augmentation.

Saphenofemoral Junction: No evidence of thrombus. Normal
compressibility and flow on color Doppler imaging.

Profunda Femoral Vein: No evidence of thrombus. Normal
compressibility and flow on color Doppler imaging.

Femoral Vein: No evidence of thrombus. Normal compressibility,
respiratory phasicity and response to augmentation.

Popliteal Vein: No evidence of thrombus. Normal compressibility,
respiratory phasicity and response to augmentation.

Calf Veins: No evidence of thrombus. Normal compressibility and flow
on color Doppler imaging.

Superficial Great Saphenous Vein: No evidence of thrombus. Normal
compressibility.

Venous Reflux:  None.

Other Findings: No evidence of superficial thrombophlebitis or
abnormal fluid collection.

LEFT LOWER EXTREMITY

Common Femoral Vein: No evidence of thrombus. Normal
compressibility, respiratory phasicity and response to augmentation.

Saphenofemoral Junction: No evidence of thrombus. Normal
compressibility and flow on color Doppler imaging.

Profunda Femoral Vein: No evidence of thrombus. Normal
compressibility and flow on color Doppler imaging.

Femoral Vein: No evidence of thrombus. Normal compressibility,
respiratory phasicity and response to augmentation.

Popliteal Vein: No evidence of thrombus. Normal compressibility,
respiratory phasicity and response to augmentation.

Calf Veins: No evidence of thrombus. Normal compressibility and flow
on color Doppler imaging.

Superficial Great Saphenous Vein: No evidence of thrombus. Normal
compressibility.

Venous Reflux:  None.

Other Findings: No evidence of superficial thrombophlebitis or
abnormal fluid collection.
IMPRESSION: No evidence of deep venous thrombosis in either lower extremity.

## 2022-01-06 ENCOUNTER — Emergency Department: Payer: Medicare Other

## 2022-01-06 ENCOUNTER — Other Ambulatory Visit: Payer: Self-pay

## 2022-01-06 ENCOUNTER — Emergency Department
Admission: EM | Admit: 2022-01-06 | Discharge: 2022-01-06 | Disposition: A | Discharge status: Home / Self Care | Payer: Medicare Other | Attending: Emergency Medicine | Admitting: Emergency Medicine

## 2022-01-06 ENCOUNTER — Encounter: Payer: Self-pay | Admitting: Emergency Medicine

## 2022-01-06 DIAGNOSIS — R0789 Other chest pain: Secondary | ICD-10-CM | POA: Diagnosis present

## 2022-01-06 DIAGNOSIS — N3000 Acute cystitis without hematuria: Secondary | ICD-10-CM | POA: Diagnosis not present

## 2022-01-06 DIAGNOSIS — I10 Essential (primary) hypertension: Secondary | ICD-10-CM | POA: Insufficient documentation

## 2022-01-06 LAB — URINALYSIS, ROUTINE W REFLEX MICROSCOPIC
Bilirubin Urine: NEGATIVE
Glucose, UA: NEGATIVE mg/dL
Hgb urine dipstick: NEGATIVE
Ketones, ur: NEGATIVE mg/dL
Nitrite: NEGATIVE
Protein, ur: NEGATIVE mg/dL
Specific Gravity, Urine: 1.009 (ref 1.005–1.030)
pH: 5 (ref 5.0–8.0)

## 2022-01-06 LAB — CBC
HCT: 37.9 % (ref 36.0–46.0)
Hemoglobin: 12.5 g/dL (ref 12.0–15.0)
MCH: 30.8 pg (ref 26.0–34.0)
MCHC: 33 g/dL (ref 30.0–36.0)
MCV: 93.3 fL (ref 80.0–100.0)
Platelets: 209 10*3/uL (ref 150–400)
RBC: 4.06 MIL/uL (ref 3.87–5.11)
RDW: 12.7 % (ref 11.5–15.5)
WBC: 7.2 10*3/uL (ref 4.0–10.5)
nRBC: 0 % (ref 0.0–0.2)

## 2022-01-06 LAB — BASIC METABOLIC PANEL
Anion gap: 8 (ref 5–15)
BUN: 16 mg/dL (ref 8–23)
CO2: 27 mmol/L (ref 22–32)
Calcium: 9.6 mg/dL (ref 8.9–10.3)
Chloride: 104 mmol/L (ref 98–111)
Creatinine, Ser: 0.98 mg/dL (ref 0.44–1.00)
GFR, Estimated: >60 mL/min (ref >60)
Glucose, Bld: 139 mg/dL — ABNORMAL HIGH (ref 70–99)
Potassium: 3.5 mmol/L (ref 3.5–5.1)
Sodium: 139 mmol/L (ref 135–145)

## 2022-01-06 LAB — TROPONIN I (HIGH SENSITIVITY): Troponin I (High Sensitivity): 3 ng/L (ref <18)

## 2022-01-06 MED ORDER — LIDOCAINE 5 % EX PTCH: 1.0000 | MEDICATED_PATCH | Freq: Two times a day (BID) | CUTANEOUS | 0 refills | Status: AC

## 2022-01-06 MED ORDER — CEPHALEXIN 500 MG PO CAPS: 500.0000 mg | ORAL_CAPSULE | Freq: Four times a day (QID) | ORAL | 0 refills | Status: AC

## 2022-01-06 MED ORDER — CEPHALEXIN 500 MG PO CAPS
500.0000 mg | ORAL_CAPSULE | Freq: Once | ORAL | Status: AC
  Administered 2022-01-06: 500 mg via ORAL
  Filled 2022-01-06: qty 1

## 2022-01-06 NOTE — ED Triage Notes (Signed)
Pt reports cp to her left chest and under her left arm sine Monday. Pt reports the pain is constant and states some times she gets SOB.

## 2022-01-06 NOTE — ED Provider Notes (Signed)
[facility] Provider Note    Event Date/Time   First MD Initiated Contact with Patient 01/06/22 1123     (approximate)   History   Chief Complaint Chest Pain and Shortness of Breath   HPI  Lynn Paul is a 71 y.o. female with past medical history of hypertension, hyperlipidemia, and GERD who presents to the ED complaining of chest pain.  Patient reports that she has had approximately 6 days of constant pain wrapping around to the left side of her chest and her left lower chest wall.  She describes the pain as constant and sharp, exacerbated by certain movements and positions.  She states she will occasionally feel short of breath, but currently denies any difficulty breathing.  She reports a dry cough but denies any fevers, has not noticed any pain or swelling in her legs.  She is not aware of any sick contacts.  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  Patient does endorse some urinary frequency but denies any dysuria or hematuria.     Physical Exam   Triage Vital Signs: ED Triage Vitals  Enc Vitals Group     BP 01/06/22 1109 (!) 141/22     Pulse Rate 01/06/22 1109 64     Resp 01/06/22 1109 20     Temp 01/06/22 1109 98.1 F (36.7 C)     Temp Source 01/06/22 1109 Oral     SpO2 01/06/22 1109 97 %     Weight 01/06/22 0725 170 lb (77.1 kg)     Height 01/06/22 0725 5\' 4"  (1.626 m)     Head Circumference --      Peak Flow --      Pain Score 01/06/22 0725 8     Pain Loc --      Pain Edu? --      Excl. in [place]? --     Most recent vital signs: Vitals:   01/06/22 1109 01/06/22 1334  BP: (!) 141/22 (!) 138/50  Pulse: 64 60  Resp: 20 20  Temp: 98.1 F (36.7 C)   SpO2: 97% 98%    Constitutional: Alert and oriented. Eyes: Conjunctivae are normal. Head: Atraumatic. Nose: No congestion/rhinnorhea. Mouth/Throat: Mucous membranes are moist.   Cardiovascular: Normal rate, regular rhythm. Grossly normal heart sounds.  2+ radial pulses bilaterally. Respiratory: Normal respiratory effort.  No retractions. Lungs CTAB.  Left lateral chest wall tenderness to palpation noted. Gastrointestinal: Soft and nontender.  No CVA tenderness to palpation.  No distention. Musculoskeletal: No lower extremity tenderness nor edema.  Neurologic:  Normal speech and language. No gross focal neurologic deficits are appreciated.    ED Results / Procedures / Treatments   Labs (all labs ordered are listed, but only abnormal results are displayed) Labs Reviewed  BASIC METABOLIC PANEL - Abnormal; Notable for the following components:      Result Value   Glucose, Bld 139 (*)    All other components within normal limits  URINALYSIS, ROUTINE W REFLEX MICROSCOPIC - Abnormal; Notable for the following components:   Color, Urine YELLOW (*)    APPearance HAZY (*)    Leukocytes,Ua LARGE (*)    Bacteria, UA RARE (*)    Non Squamous Epithelial PRESENT (*)    All other components within normal limits  URINE CULTURE  CBC  TROPONIN I (HIGH SENSITIVITY)     EKG  ED ECG  REPORT I, Chesley Noon, the attending physician, personally viewed and interpreted this ECG.   Date: 01/06/2022  EKG Time: 7:27  Rate: 65  Rhythm: normal sinus rhythm  Axis: Normal  Intervals:none  ST&T Change: None  RADIOLOGY Chest x-ray reviewed and interpreted by me with no infiltrate, edema, or effusion.  PROCEDURES:  Critical Care performed: No  Procedures   MEDICATIONS ORDERED IN ED: Medications  cephALEXin (KEFLEX) capsule 500 mg (has no administration in time range)     IMPRESSION / MDM / ASSESSMENT AND PLAN / ED COURSE  I reviewed the triage vital signs and the nursing notes.                              71 y.o. female with past medical history of hypertension, hyperlipidemia, and GERD who presents to the ED complaining of 6 days of constant sharp pain in  the left side of her chest that is worse with certain movements.  Patient's presentation is most consistent with acute presentation with potential threat to life or bodily function.  Differential diagnosis includes, but is not limited to, ACS, PE, pneumonia, pneumothorax, gastritis, UTI, pyelonephritis, musculoskeletal pain, shingles.  Patient well-appearing and in no acute distress, vital signs are unremarkable.  EKG shows no evidence of arrhythmia or ischemia and troponin is negative, doubt ACS given atypical symptoms that have been constant for 6 days.  Chest x-ray is also unremarkable, remainder of labs are reassuring with no significant anemia, leukocytosis, electrolyte abnormality, or AKI.  Her pain is reproducible with palpation and would favor musculoskeletal cause, no rash to raise suspicion for shingles.  We will check urinalysis given patient's urinary symptoms.  Urinalysis does show signs of infection, but no CVA tenderness to suggest pyelonephritis.  Patient given initial dose of Keflex and urine sent for culture, she is appropriate for outpatient management with PCP follow-up.  Suspect musculoskeletal etiology for her chest pain and she was also prescribed Lidoderm patches.  She was counseled to return to the ED for new or worsening symptoms, patient agrees with plan.      FINAL CLINICAL IMPRESSION(S) / ED DIAGNOSES   Final diagnoses:  Atypical chest pain  Chest wall pain  Acute cystitis without hematuria     Rx / DC Orders   ED Discharge Orders          Ordered    cephALEXin (KEFLEX) 500 MG capsule  4 times daily        01/06/22 1338    lidocaine (LIDODERM) 5 %  Every 12 hours        01/06/22 1339             Note:  This document was prepared using Dragon voice recognition software and may include unintentional dictation errors.   Chesley Noon, MD 01/06/22 1339

## 2022-01-07 LAB — URINE CULTURE: Culture: <10000 — AB

## 2023-08-06 ENCOUNTER — Other Ambulatory Visit: Payer: Self-pay | Admitting: Family Medicine

## 2023-08-06 DIAGNOSIS — Z720 Tobacco use: Secondary | ICD-10-CM

## 2023-08-06 DIAGNOSIS — Z0000 Encounter for general adult medical examination without abnormal findings: Secondary | ICD-10-CM

## 2023-08-19 ENCOUNTER — Ambulatory Visit
Admission: RE | Admit: 2023-08-19 | Discharge: 2023-08-19 | Disposition: A | Discharge status: Home / Self Care | Source: Ambulatory Visit | Attending: Family Medicine | Admitting: Family Medicine

## 2023-08-19 DIAGNOSIS — J439 Emphysema, unspecified: Secondary | ICD-10-CM | POA: Insufficient documentation

## 2023-08-19 DIAGNOSIS — Z0000 Encounter for general adult medical examination without abnormal findings: Secondary | ICD-10-CM | POA: Insufficient documentation

## 2023-08-19 DIAGNOSIS — Z720 Tobacco use: Secondary | ICD-10-CM | POA: Insufficient documentation

## 2023-08-19 DIAGNOSIS — I700 Atherosclerosis of aorta: Secondary | ICD-10-CM | POA: Diagnosis not present

## 2023-08-19 DIAGNOSIS — Z122 Encounter for screening for malignant neoplasm of respiratory organs: Secondary | ICD-10-CM | POA: Diagnosis not present

## 2023-08-19 DIAGNOSIS — Z87891 Personal history of nicotine dependence: Secondary | ICD-10-CM | POA: Diagnosis not present

## 2023-08-19 DIAGNOSIS — I2510 Atherosclerotic heart disease of native coronary artery without angina pectoris: Secondary | ICD-10-CM | POA: Diagnosis not present
